# Patient Record
Sex: Male | Born: 1965 | Hispanic: Yes | Marital: Married | Smoking: Former smoker
Health system: Southern US, Community
[De-identification: ages and names within clinical notes are randomized; demographics above are authoritative.]

## PROBLEM LIST (undated history)

## (undated) DIAGNOSIS — I10 Essential (primary) hypertension: Secondary | ICD-10-CM

## (undated) DIAGNOSIS — K7460 Unspecified cirrhosis of liver: Secondary | ICD-10-CM

## (undated) DIAGNOSIS — E119 Type 2 diabetes mellitus without complications: Secondary | ICD-10-CM

## (undated) DIAGNOSIS — R188 Other ascites: Secondary | ICD-10-CM

## (undated) DIAGNOSIS — E785 Hyperlipidemia, unspecified: Secondary | ICD-10-CM

## (undated) HISTORY — PX: BACK SURGERY: SHX140

## (undated) HISTORY — PX: LIPOMA EXCISION: SHX5283

---

## 2008-05-28 ENCOUNTER — Emergency Department: Payer: Self-pay | Admitting: Emergency Medicine

## 2009-03-28 ENCOUNTER — Emergency Department: Payer: Self-pay | Admitting: Emergency Medicine

## 2010-05-05 IMAGING — CR CERVICAL SPINE - COMPLETE 4+ VIEW
1 series · 8 of 8 positions shown · non-contrast
Comparison: none

REASON FOR EXAM: pain in right side of neck; no injury
COMMENTS:

[Series 1: view not recorded · 0.17mm/px · 8 of 8 slices shown]
[im 1/8]
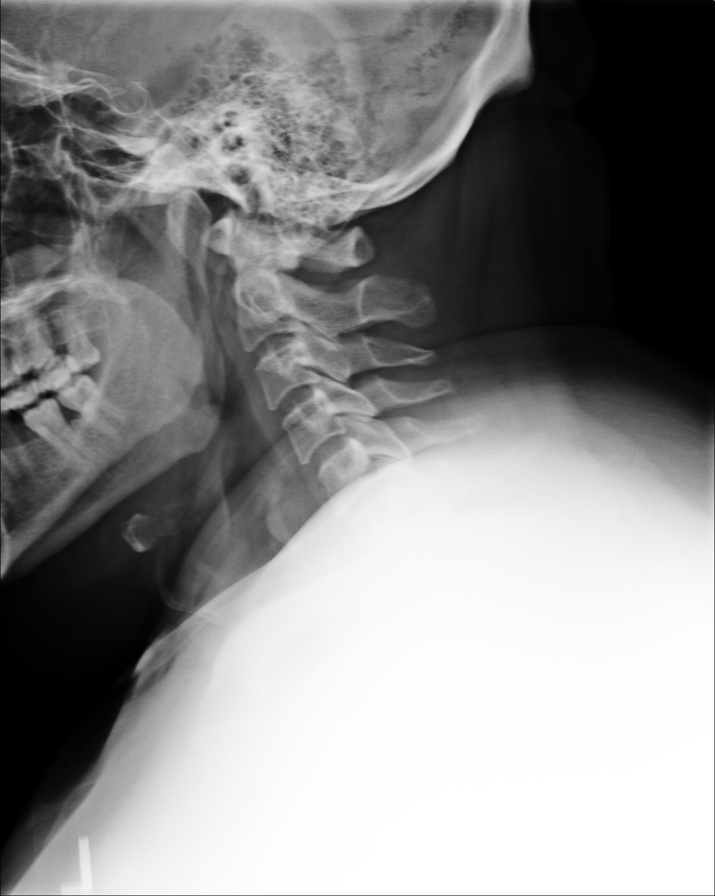
[im 2/8]
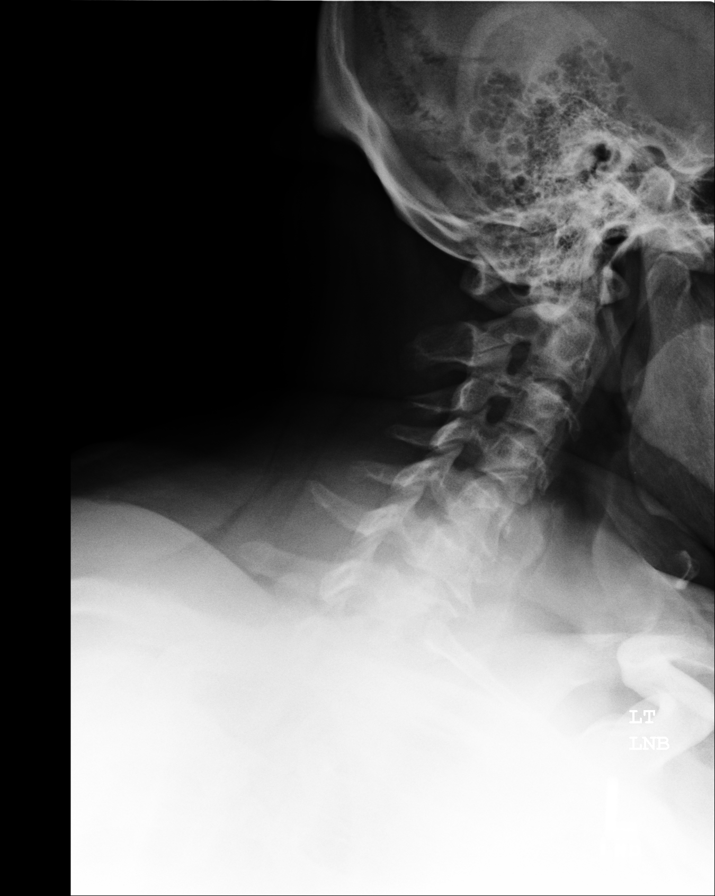
[im 3/8]
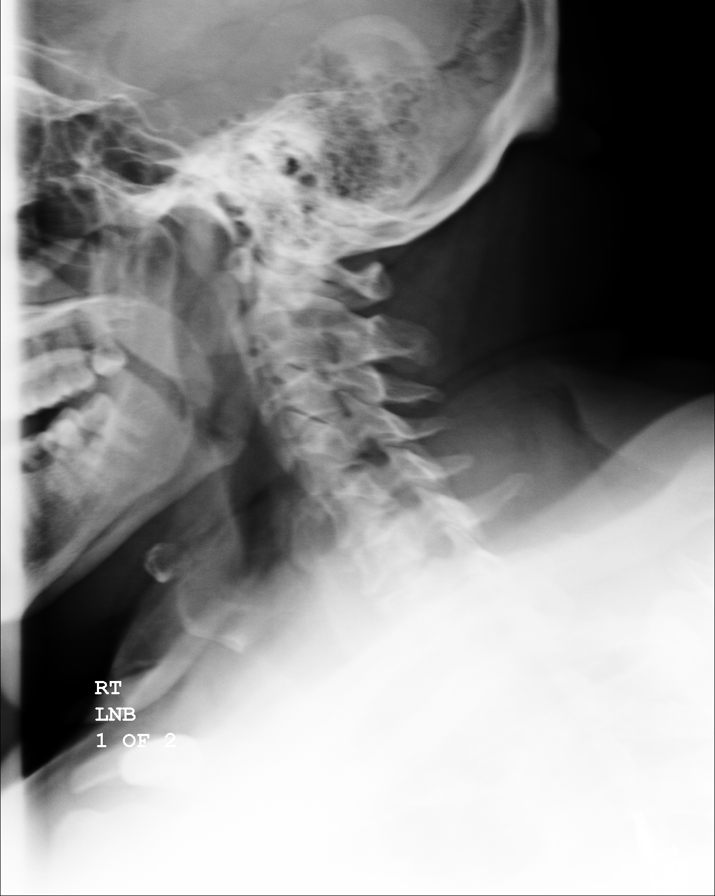
[im 4/8]
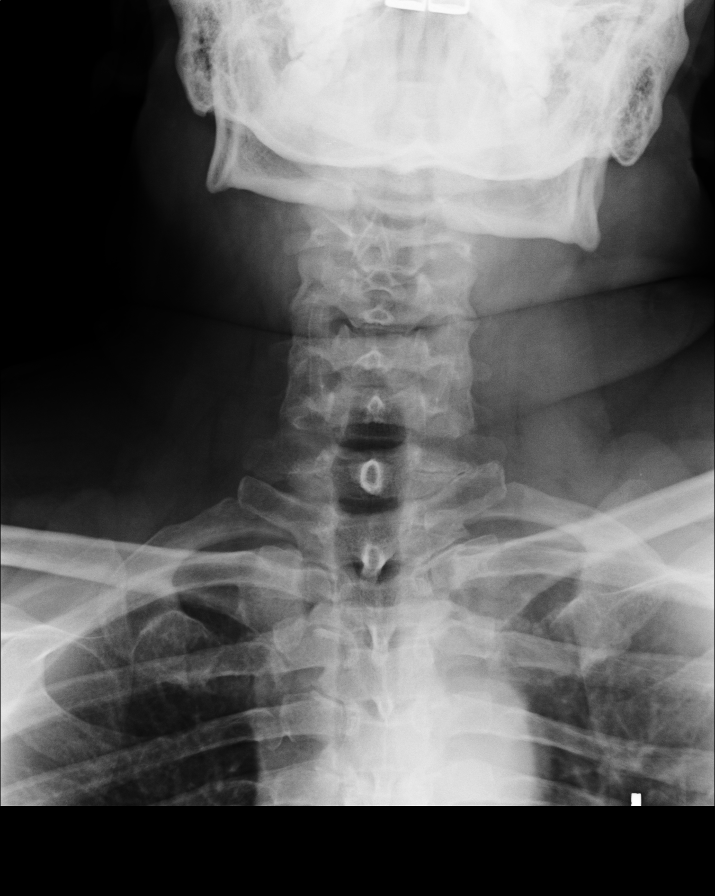
[im 5/8]
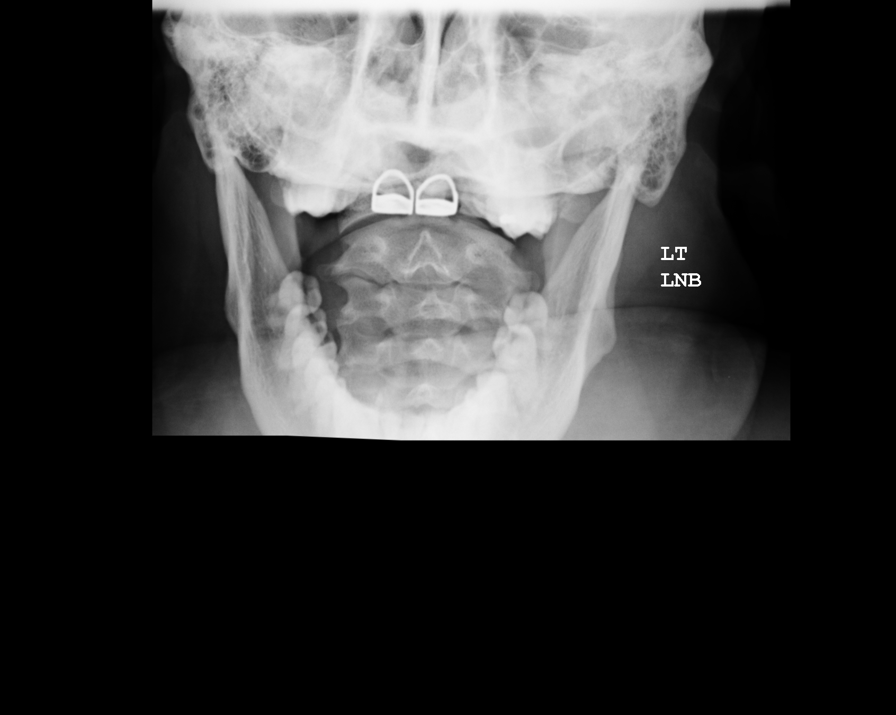
[im 6/8]
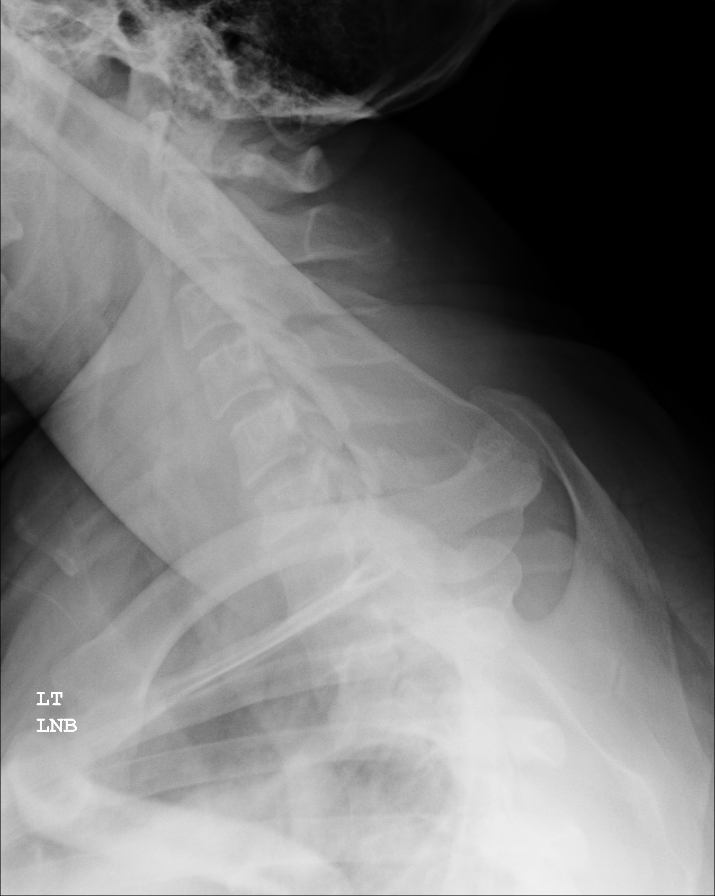
[im 7/8]
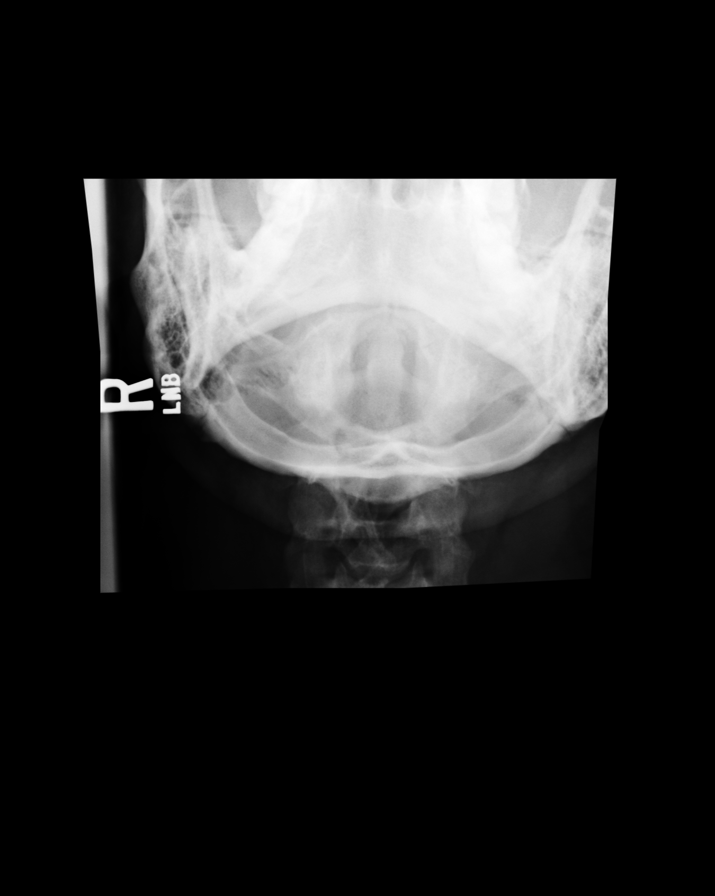
[im 8/8]
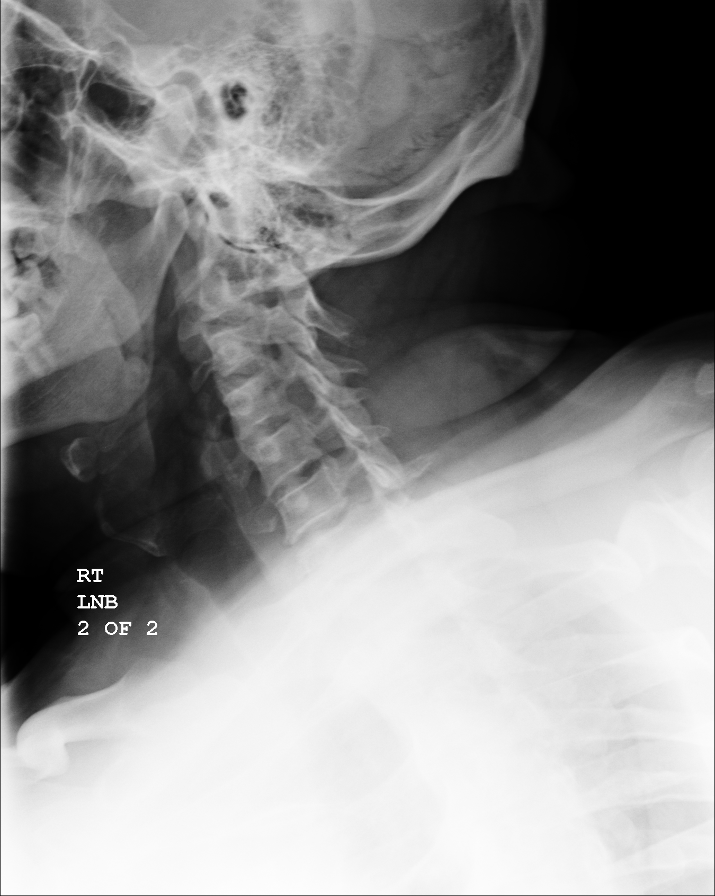

[8 of 8 positions shown; findings below may reference images not displayed]

PROCEDURE:     DXR - DXR CERVICAL SPINE COMPLETE  - May 28, 2008  [DATE]

RESULT:     The vertebral body heights and the intervertebral disc spaces
are well maintained. The vertebral body alignment is normal. Oblique view
shows no significant abnormalities of the articular facets. The odontoid
process is intact. The soft tissues of the cervical area reveal no
significant abnormalities.
IMPRESSION: No acute changes are identified.

## 2011-03-05 IMAGING — US ABDOMEN ULTRASOUND
1 series · 17 of 25 positions shown · non-contrast
Comparison: none

REASON FOR EXAM: Abd pain, elevated LFT
COMMENTS:

[Series 1: abdomen ultrasound · 17 of 52 slices shown]
[im 1/52]
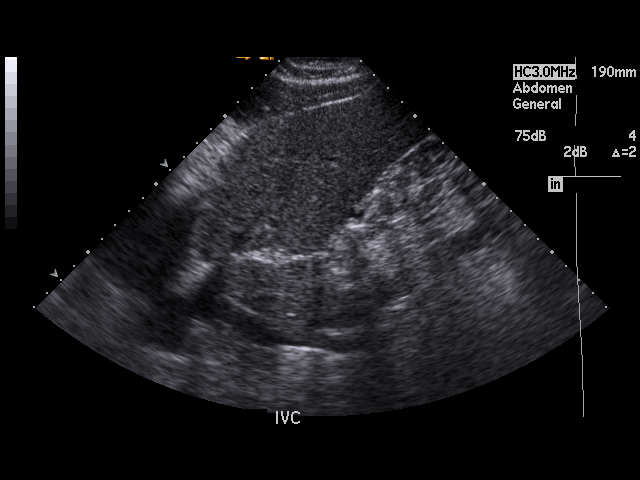
[im 5/52]
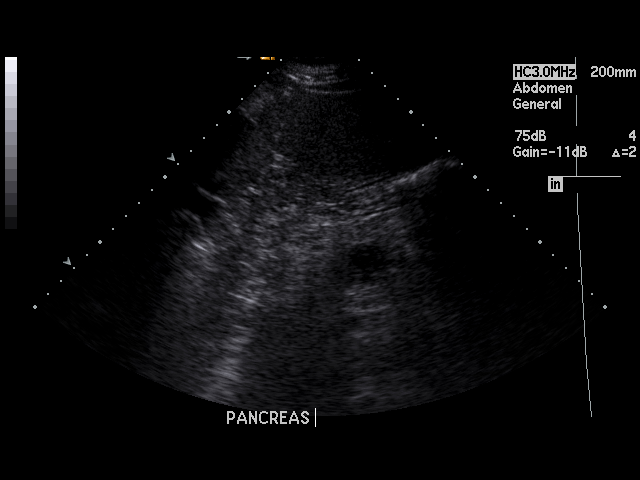
[im 7/52]
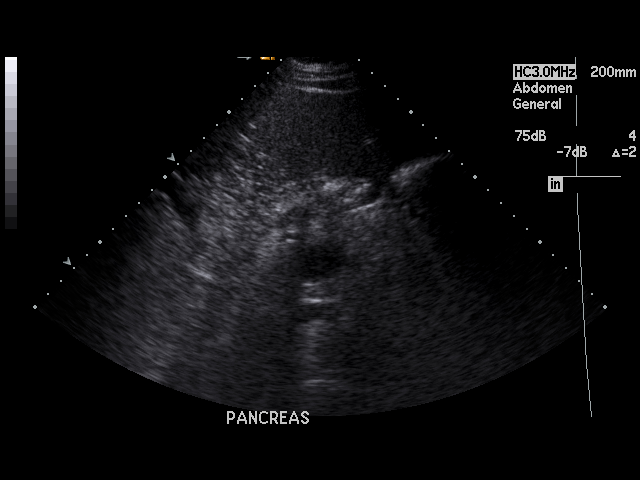
[im 11/52]
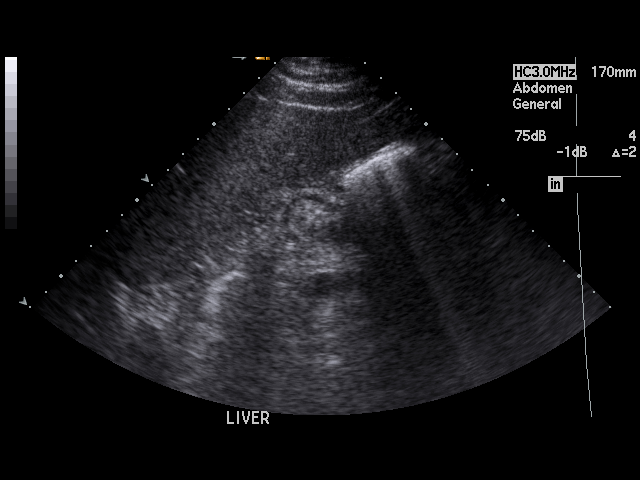
[im 13/52]
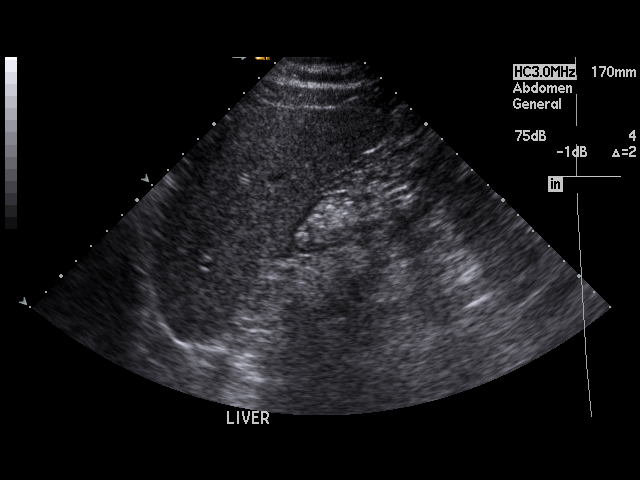
[im 18/52]
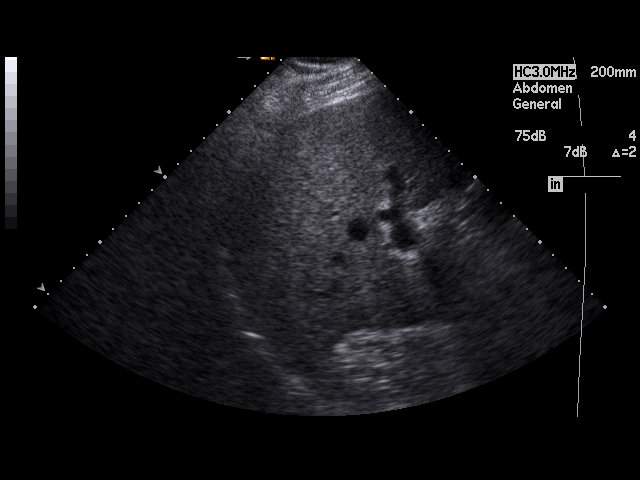
[im 20/52]
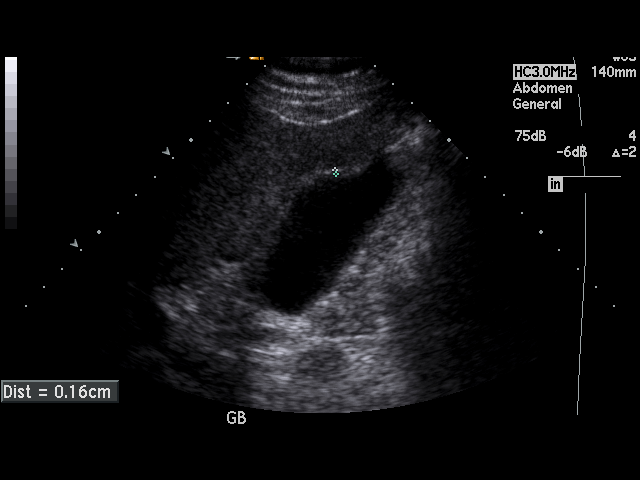
[im 24/52]
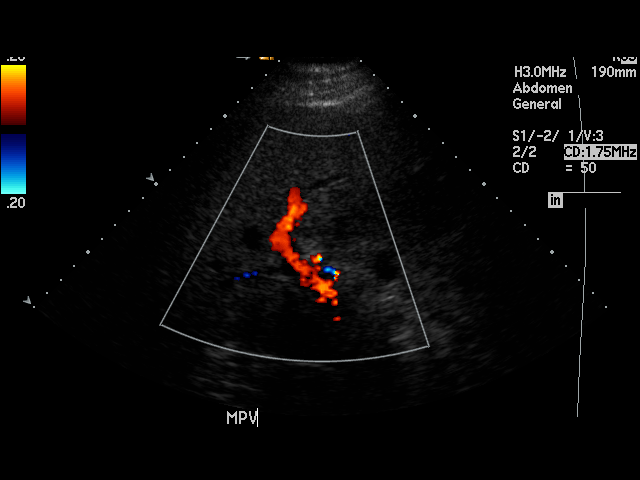
[im 26/52]
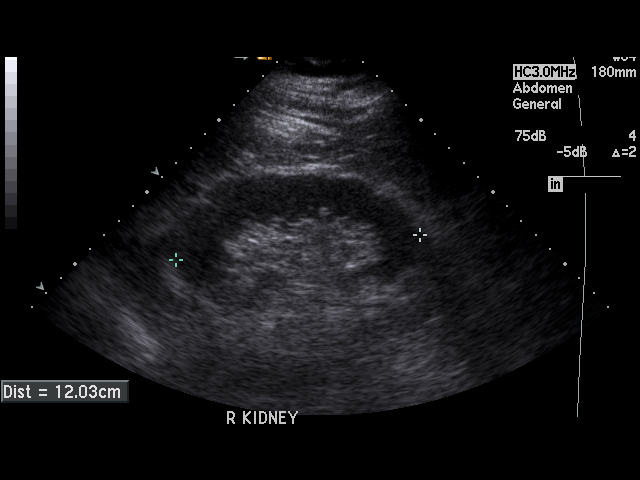
[im 28/52]
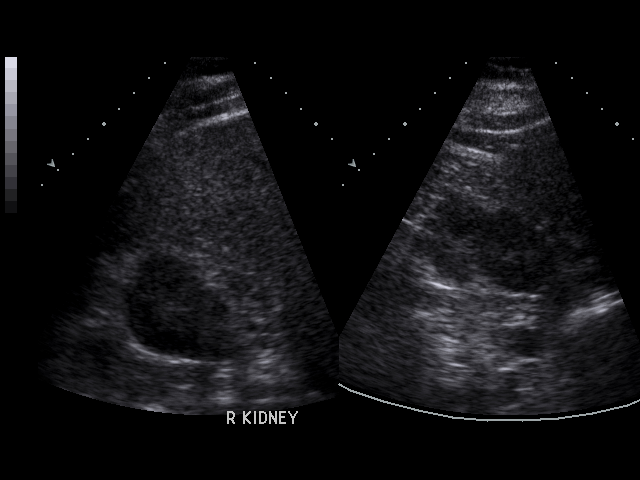
[im 32/52]
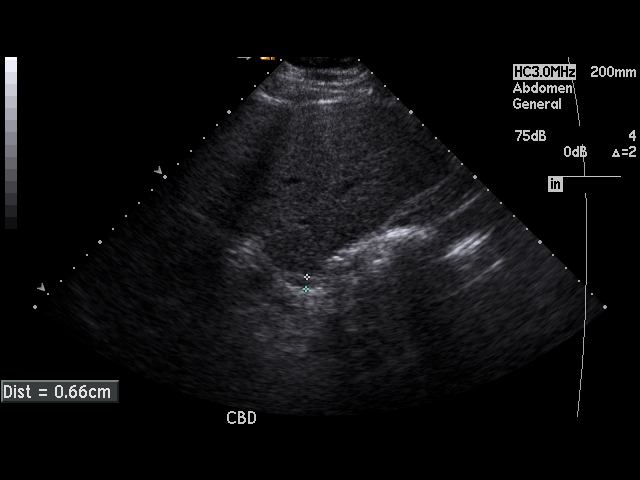
[im 35/52]
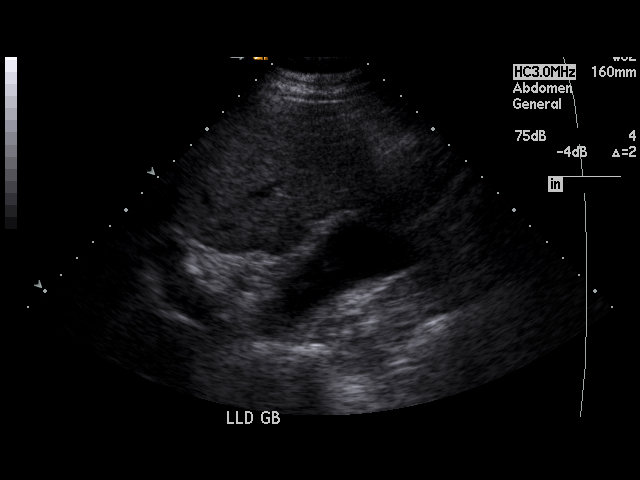
[im 39/52]
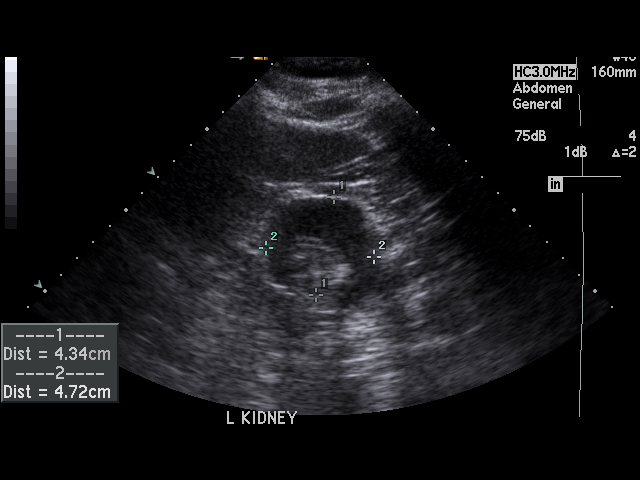
[im 41/52]
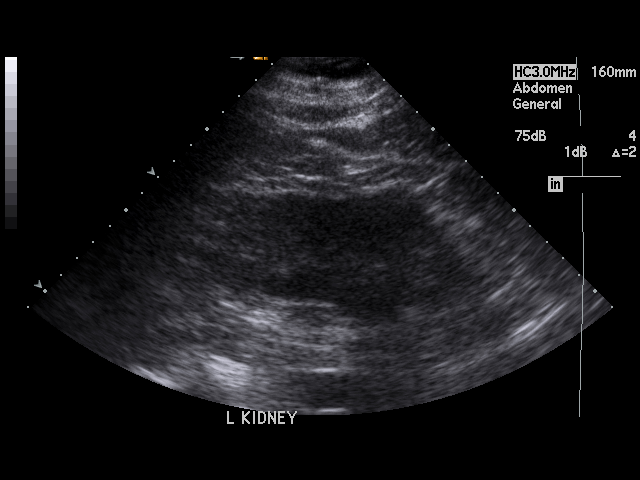
[im 45/52]
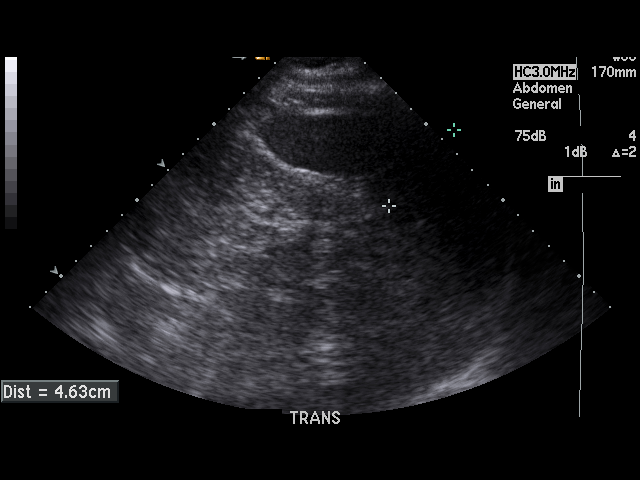
[im 47/52]
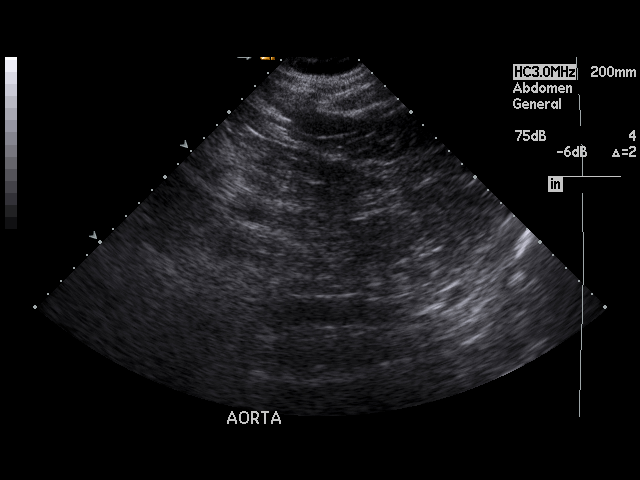
[im 52/52]
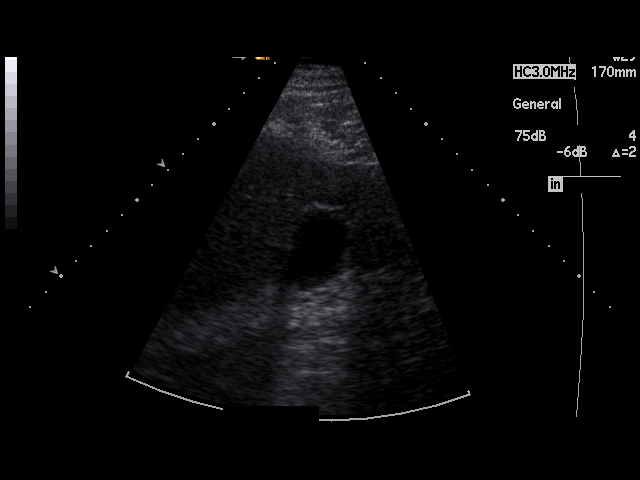

[17 of 25 positions shown; findings below may reference images not displayed]

PROCEDURE:     US  - US ABDOMEN GENERAL SURVEY  - March 28, 2009 [DATE]

RESULT:     The liver exhibits normal echotexture with no focal mass nor
ductal dilation. There may be an element of fatty infiltration present.
Portal venous flow is normal in direction toward the liver. The gallbladder
is adequately distended with no evidence of stones, wall thickening, or
pericholecystic fluid. The common bile duct is top normal at 6.8 mm in
diameter. The spleen is mildly enlarged at 13.8 mm in diameter. The pancreas
could not be demonstrated adequately due to the presence of bowel gas. The
abdominal aorta and kidneys are normal in appearance.
IMPRESSION: 1. There are mild fatty infiltrative changes of the liver. I see no evidence
of gallstones. The common bile duct is borderline dilated at 6.8 mm.
2. The spleen is top normal in size at 13.8 centimeters in greatest
dimension.

## 2014-04-04 LAB — MICROALBUMIN, URINE

## 2014-04-04 LAB — PSA

## 2014-05-01 ENCOUNTER — Emergency Department: Payer: Self-pay | Admitting: Emergency Medicine

## 2015-04-04 DIAGNOSIS — E119 Type 2 diabetes mellitus without complications: Secondary | ICD-10-CM | POA: Insufficient documentation

## 2015-04-04 DIAGNOSIS — I10 Essential (primary) hypertension: Secondary | ICD-10-CM | POA: Insufficient documentation

## 2015-04-04 DIAGNOSIS — E785 Hyperlipidemia, unspecified: Secondary | ICD-10-CM

## 2015-04-09 ENCOUNTER — Encounter: Payer: Self-pay | Admitting: Unknown Physician Specialty

## 2015-04-09 ENCOUNTER — Ambulatory Visit (INDEPENDENT_AMBULATORY_CARE_PROVIDER_SITE_OTHER): Payer: BLUE CROSS/BLUE SHIELD | Admitting: Unknown Physician Specialty

## 2015-04-09 VITALS — BP 152/101 | HR 76 | Temp 99.1°F | Ht 64.3 in | Wt 261.6 lb

## 2015-04-09 DIAGNOSIS — E668 Other obesity: Secondary | ICD-10-CM | POA: Insufficient documentation

## 2015-04-09 DIAGNOSIS — Z0000 Encounter for general adult medical examination without abnormal findings: Secondary | ICD-10-CM

## 2015-04-09 DIAGNOSIS — E119 Type 2 diabetes mellitus without complications: Secondary | ICD-10-CM | POA: Diagnosis not present

## 2015-04-09 DIAGNOSIS — E785 Hyperlipidemia, unspecified: Secondary | ICD-10-CM

## 2015-04-09 DIAGNOSIS — I10 Essential (primary) hypertension: Secondary | ICD-10-CM

## 2015-04-09 DIAGNOSIS — E669 Obesity, unspecified: Secondary | ICD-10-CM | POA: Insufficient documentation

## 2015-04-09 DIAGNOSIS — G4730 Sleep apnea, unspecified: Secondary | ICD-10-CM | POA: Diagnosis not present

## 2015-04-09 LAB — LIPID PANEL PICCOLO, WAIVED
CHOL/HDL RATIO PICCOLO,WAIVE: 3.5 mg/dL
CHOLESTEROL PICCOLO, WAIVED: 217 mg/dL — AB (ref <200)
HDL CHOL PICCOLO, WAIVED: 63 mg/dL (ref >59)
LDL Chol Calc Piccolo Waived: 116 mg/dL — ABNORMAL HIGH (ref <100)
Triglycerides Piccolo,Waived: 194 mg/dL — ABNORMAL HIGH (ref <150)
VLDL Chol Calc Piccolo,Waive: 39 mg/dL — ABNORMAL HIGH (ref <30)

## 2015-04-09 LAB — MICROALBUMIN, URINE WAIVED
Creatinine, Urine Waived: 300 mg/dL (ref 10–300)
Microalb, Ur Waived: 80 mg/L — ABNORMAL HIGH (ref 0–19)

## 2015-04-09 LAB — BAYER DCA HB A1C WAIVED: HB A1C (BAYER DCA - WAIVED): 10.2 % — ABNORMAL HIGH (ref <7.0)

## 2015-04-09 MED ORDER — METFORMIN HCL 500 MG PO TABS: 500.0000 mg | ORAL_TABLET | Freq: Two times a day (BID) | ORAL | Status: DC

## 2015-04-09 MED ORDER — LISINOPRIL 10 MG PO TABS: 10.0000 mg | ORAL_TABLET | Freq: Every day | ORAL | Status: DC

## 2015-04-09 MED ORDER — ATORVASTATIN CALCIUM 20 MG PO TABS: 20.0000 mg | ORAL_TABLET | Freq: Every day | ORAL | Status: DC

## 2015-04-09 NOTE — Assessment & Plan Note (Signed)
High today.  Restart Lisinopril

## 2015-04-09 NOTE — Assessment & Plan Note (Signed)
Order sleep study 

## 2015-04-09 NOTE — Progress Notes (Signed)
BP 152/101 mmHg  Pulse 76  Temp(Src) 99.1 F (37.3 C)  Ht 5' 4.3" (1.633 m)  Wt 261 lb 9.6 oz (118.661 kg)  BMI 44.50 kg/m2  SpO2 94%   Subjective:    Patient ID: Jonathan Griffin, male    DOB: 1965-06-02, 50 y.o.   MRN: [id number]  HPI: Jonathan Griffin is a 50 y.o. male  Chief Complaint  Patient presents with  . Annual Exam   Pt with a history of diabetes and hypertension.  He is currently lost to follow up and out of medications.  Pt denies any problems.  He does admit to snoring at night, periods of apnea, and falling asleep during the day  Family History  Problem Relation Age of Onset  . Diabetes Father   . Hypertension Father    Past Surgical History  Procedure Laterality Date  . Lipoma excision      neck    Relevant past medical, surgical, family and social history reviewed and updated as indicated. Interim medical history since our last visit reviewed. Allergies and medications reviewed and updated.  Review of Systems  Constitutional: Negative.   HENT: Negative.   Eyes: Negative.   Respiratory: Negative.   Cardiovascular: Negative.   Gastrointestinal: Negative.   Endocrine: Negative.   Genitourinary: Negative.   Skin: Negative.   Allergic/Immunologic: Negative.   Neurological: Negative.   Hematological: Negative.   Psychiatric/Behavioral: Negative.     Per HPI unless specifically indicated above     Objective:    BP 152/101 mmHg  Pulse 76  Temp(Src) 99.1 F (37.3 C)  Ht 5' 4.3" (1.633 m)  Wt 261 lb 9.6 oz (118.661 kg)  BMI 44.50 kg/m2  SpO2 94%  Wt Readings from Last 3 Encounters:  04/09/15 261 lb 9.6 oz (118.661 kg)  04/04/14 262 lb (118.842 kg)    Physical Exam  Constitutional: He is oriented to person, place, and time. He appears well-developed and well-nourished.  HENT:  Head: Normocephalic.  Right Ear: Tympanic membrane, external ear and ear canal normal.  Left Ear: Tympanic membrane, external ear and ear canal normal.   Mouth/Throat: Uvula is midline, oropharynx is clear and moist and mucous membranes are normal.  Eyes: Pupils are equal, round, and reactive to light.  Cardiovascular: Normal rate, regular rhythm and normal heart sounds.  Exam reveals no gallop and no friction rub.   No murmur heard. Pulmonary/Chest: Effort normal and breath sounds normal. No respiratory distress.  Abdominal: Soft. Bowel sounds are normal. He exhibits no distension. There is no tenderness.  Musculoskeletal: Normal range of motion.  Neurological: He is alert and oriented to person, place, and time. He has normal reflexes.  Skin: Skin is warm and dry.  Psychiatric: He has a normal mood and affect. His behavior is normal. Judgment and thought content normal.  Nursing note and vitals reviewed.  Diabetic Foot Exam - Simple   Simple Foot Form  Diabetic Foot exam was performed with the following findings:  Yes 04/09/2015 10:28 AM  Visual Inspection  No deformities, no ulcerations, no other skin breakdown bilaterally:  Yes  Sensation Testing  Intact to touch and monofilament testing bilaterally:  Yes  Pulse Check  Posterior Tibialis and Dorsalis pulse intact bilaterally:  Yes  Comments         Assessment & Plan:   Problem List Items Addressed This Visit      Unprioritized   Hyperlipidemia    Start Atorvastain 20 mg due to risk factors.  Awaiting lipid panel      Relevant Medications   lisinopril (PRINIVIL,ZESTRIL) 10 MG tablet   atorvastatin (LIPITOR) 20 MG tablet   Other Relevant Orders   Lipid Panel Piccolo, Waived   Type 2 diabetes mellitus without complications (HCC)    Lost to f/u.  Hgb A1C is 10.5.  Refusing lifestyle center      Relevant Medications   metFORMIN (GLUCOPHAGE) 500 MG tablet   lisinopril (PRINIVIL,ZESTRIL) 10 MG tablet   atorvastatin (LIPITOR) 20 MG tablet   Other Relevant Orders   Bayer DCA Hb A1c Waived   Hypertension - Primary    High today.  Restart Lisinopril      Relevant  Medications   lisinopril (PRINIVIL,ZESTRIL) 10 MG tablet   atorvastatin (LIPITOR) 20 MG tablet   Other Relevant Orders   Comprehensive metabolic panel   Microalbumin, Urine Waived   Uric acid   Sleep apnea    Order sleep study      Relevant Orders   Ambulatory referral to Sleep Studies   Extreme obesity (HCC)   Relevant Medications   metFORMIN (GLUCOPHAGE) 500 MG tablet   Other Relevant Orders   Ambulatory referral to Sleep Studies    Other Visit Diagnoses    Routine general medical examination at a health care facility        Relevant Orders    CBC with Differential/Platelet    HIV antibody    TSH    PSA        Follow up plan: Return in about 4 weeks (around 05/07/2015).

## 2015-04-09 NOTE — Assessment & Plan Note (Addendum)
Lost to f/u.  Hgb A1C is 10.5.  Refusing lifestyle center

## 2015-04-09 NOTE — Assessment & Plan Note (Addendum)
Start Atorvastain 20 mg due to risk factors.  Awaiting lipid panel

## 2015-04-09 NOTE — Patient Instructions (Signed)
Diabetes and Exercise Exercising regularly is important. It is not just about losing weight. It has many health benefits, such as:  Improving your overall fitness, flexibility, and endurance.  Increasing your bone density.  Helping with weight control.  Decreasing your body fat.  Increasing your muscle strength.  Reducing stress and tension.  Improving your overall health. People with diabetes who exercise gain additional benefits because exercise:  Reduces appetite.  Improves the body's use of blood sugar (glucose).  Helps lower or control blood glucose.  Decreases blood pressure.  Helps control blood lipids (such as cholesterol and triglycerides).  Improves the body's use of the hormone insulin by:  Increasing the body's insulin sensitivity.  Reducing the body's insulin needs.  Decreases the risk for heart disease because exercising:  Lowers cholesterol and triglycerides levels.  Increases the levels of good cholesterol (such as high-density lipoproteins [HDL]) in the body.  Lowers blood glucose levels. YOUR ACTIVITY PLAN  Choose an activity that you enjoy, and set realistic goals. To exercise safely, you should begin practicing any new physical activity slowly, and gradually increase the intensity of the exercise over time. Your health care provider or diabetes educator can help create an activity plan that works for you. General recommendations include:  Encouraging children to engage in at least 60 minutes of physical activity each day.  Stretching and performing strength training exercises, such as yoga or weight lifting, at least 2 times per week.  Performing a total of at least 150 minutes of moderate-intensity exercise each week, such as brisk walking or water aerobics.  Exercising at least 3 days per week, making sure you allow no more than 2 consecutive days to pass without exercising.  Avoiding long periods of inactivity (90 minutes or more). When you  have to spend an extended period of time sitting down, take frequent breaks to walk or stretch. RECOMMENDATIONS FOR EXERCISING WITH TYPE 1 OR TYPE 2 DIABETES   Check your blood glucose before exercising. If blood glucose levels are greater than 240 mg/dL, check for urine ketones. Do not exercise if ketones are present.  Avoid injecting insulin into areas of the body that are going to be exercised. For example, avoid injecting insulin into:  The arms when playing tennis.  The legs when jogging.  Keep a record of:  Food intake before and after you exercise.  Expected peak times of insulin action.  Blood glucose levels before and after you exercise.  The type and amount of exercise you have done.  Review your records with your health care provider. Your health care provider will help you to develop guidelines for adjusting food intake and insulin amounts before and after exercising.  If you take insulin or oral hypoglycemic agents, watch for signs and symptoms of hypoglycemia. They include:  Dizziness.  Shaking.  Sweating.  Chills.  Confusion.  Drink plenty of water while you exercise to prevent dehydration or heat stroke. Body water is lost during exercise and must be replaced.  Talk to your health care provider before starting an exercise program to make sure it is safe for you. Remember, almost any type of activity is better than none.   This information is not intended to replace advice given to you by your health care provider. Make sure you discuss any questions you have with your health care provider.   Document Released: 04/11/2003 Document Revised: 06/05/2014 Document Reviewed: 06/28/2012 Elsevier Interactive Patient Education 2016 [employer] Diabetes Mellitus and Food It is important for   you to manage your blood sugar (glucose) level. Your blood glucose level can be greatly affected by what you eat. Eating healthier foods in the appropriate amounts throughout  the day at about the same time each day will help you control your blood glucose level. It can also help slow or prevent worsening of your diabetes mellitus. Healthy eating may even help you improve the level of your blood pressure and reach or maintain a healthy weight.  General recommendations for healthful eating and cooking habits include:  Eating meals and snacks regularly. Avoid going long periods of time without eating to lose weight.  Eating a diet that consists mainly of plant-based foods, such as fruits, vegetables, nuts, legumes, and whole grains.  Using low-heat cooking methods, such as baking, instead of high-heat cooking methods, such as deep frying. Work with your dietitian to make sure you understand how to use the Nutrition Facts information on food labels. HOW CAN FOOD AFFECT ME? Carbohydrates Carbohydrates affect your blood glucose level more than any other type of food. Your dietitian will help you determine how many carbohydrates to eat at each meal and teach you how to count carbohydrates. Counting carbohydrates is important to keep your blood glucose at a healthy level, especially if you are using insulin or taking certain medicines for diabetes mellitus. Alcohol Alcohol can cause sudden decreases in blood glucose (hypoglycemia), especially if you use insulin or take certain medicines for diabetes mellitus. Hypoglycemia can be a life-threatening condition. Symptoms of hypoglycemia (sleepiness, dizziness, and disorientation) are similar to symptoms of having too much alcohol.  If your health care provider has given you approval to drink alcohol, do so in moderation and use the following guidelines:  Women should not have more than one drink per day, and men should not have more than two drinks per day. One drink is equal to:  12 oz of beer.  5 oz of wine.  1 oz of hard liquor.  Do not drink on an empty stomach.  Keep yourself hydrated. Have water, diet soda, or  unsweetened iced tea.  Regular soda, juice, and other mixers might contain a lot of carbohydrates and should be counted. WHAT FOODS ARE NOT RECOMMENDED? As you make food choices, it is important to remember that all foods are not the same. Some foods have fewer nutrients per serving than other foods, even though they might have the same number of calories or carbohydrates. It is difficult to get your body what it needs when you eat foods with fewer nutrients. Examples of foods that you should avoid that are high in calories and carbohydrates but low in nutrients include:  Trans fats (most processed foods list trans fats on the Nutrition Facts label).  Regular soda.  Juice.  Candy.  Sweets, such as cake, pie, doughnuts, and cookies.  Fried foods. WHAT FOODS CAN I EAT? Eat nutrient-rich foods, which will nourish your body and keep you healthy. The food you should eat also will depend on several factors, including:  The calories you need.  The medicines you take.  Your weight.  Your blood glucose level.  Your blood pressure level.  Your cholesterol level. You should eat a variety of foods, including:  Protein.  Lean cuts of meat.  Proteins low in saturated fats, such as fish, egg whites, and beans. Avoid processed meats.  Fruits and vegetables.  Fruits and vegetables that may help control blood glucose levels, such as apples, mangoes, and yams.  Dairy products.  Choose fat-free   or low-fat dairy products, such as milk, yogurt, and cheese.  Grains, bread, pasta, and rice.  Choose whole grain products, such as multigrain bread, whole oats, and brown rice. These foods may help control blood pressure.  Fats.  Foods containing healthful fats, such as nuts, avocado, olive oil, canola oil, and fish. DOES EVERYONE WITH DIABETES MELLITUS HAVE THE SAME MEAL PLAN? Because every person with diabetes mellitus is different, there is not one meal plan that works for everyone. It  is very important that you meet with a dietitian who will help you create a meal plan that is just right for you.   This information is not intended to replace advice given to you by your health care provider. Make sure you discuss any questions you have with your health care provider.   Document Released: 10/16/2004 Document Revised: 02/09/2014 Document Reviewed: 12/16/2012 Elsevier Interactive Patient Education 2016 [employer] Tips for Eating Away From Home If You Have Diabetes Controlling your level of blood glucose, also known as blood sugar, can be challenging. It can be even more difficult when you do not prepare your own meals. The following tips can help you manage your diabetes when you eat away from home. PLANNING AHEAD Plan ahead if you know you will be eating away from home:  Ask your health care provider how to time meals and medicine if you are taking insulin.  Make a list of restaurants near you that offer healthy choices. If they have a carry-out menu, take it home and plan what you will order ahead of time.  Look up the restaurant you want to eat at online. Many chain and fast-food restaurants list nutritional information online. Use this information to choose the healthiest options and to calculate how many carbohydrates will be in your meal.  Use a carbohydrate-counting book or mobile app to look up the carbohydrate content and serving size of the foods you want to eat.  Become familiar with serving sizes and learn to recognize how many servings are in a portion. This will allow you to estimate how many carbohydrates you can eat. FREE FOODS A "free food" is any food or drink that has less than 5 g of carbohydrates per serving. Free foods include:  Many vegetables.  Hard boiled eggs.  Nuts or seeds.  Olives.  Cheeses.  Meats. These types of foods make good appetizer choices and are often available at salad bars. Lemon juice, vinegar, or a low-calorie salad  dressing of fewer than 20 calories per serving can be used as a "free" salad dressing.  CHOICES TO REDUCE CARBOHYDRATES  Substitute nonfat sweetened yogurt with a sugar-free yogurt. Yogurt made from soy milk may also be used, but you will still want a sugar-free or plain option to choose a lower carbohydrate amount.  Ask your server to take away the bread basket or chips from your table.  Order fresh fruit. A salad bar often offers fresh fruit choices. Avoid canned fruit because it is usually packed in sugar or syrup.  Order a salad, and eat it without dressing. Or, create a "free" salad dressing.  Ask for substitutions. For example, instead of French fries, request an order of a vegetable such as salad, green beans, or broccoli. OTHER TIPS   If you take insulin, take the insulin once your food arrives to your table. This will ensure your insulin and food are timed correctly.  Ask your server about the portion size before your order, and ask for a   take-out box if the portion has more servings than you should have. When your food comes, leave the amount you should have on the plate, and put the rest in the take-out box.  Consider splitting an entree with someone and ordering a side salad.   This information is not intended to replace advice given to you by your health care provider. Make sure you discuss any questions you have with your health care provider.   Document Released: 01/19/2005 Document Revised: 10/10/2014 Document Reviewed: 04/18/2013 Elsevier Interactive Patient Education 2016 [employer]  

## 2015-04-10 ENCOUNTER — Encounter: Payer: Self-pay | Admitting: Unknown Physician Specialty

## 2015-04-10 LAB — COMPREHENSIVE METABOLIC PANEL
ALT: 24 IU/L (ref 0–44)
AST: 27 IU/L (ref 0–40)
Albumin/Globulin Ratio: 1.3 (ref 1.1–2.5)
Albumin: 4.2 g/dL (ref 3.5–5.5)
Alkaline Phosphatase: 136 IU/L — ABNORMAL HIGH (ref 39–117)
BUN/Creatinine Ratio: 11 (ref 9–20)
BUN: 8 mg/dL (ref 6–24)
Bilirubin Total: 0.7 mg/dL (ref 0.0–1.2)
CALCIUM: 9.1 mg/dL (ref 8.7–10.2)
CO2: 24 mmol/L (ref 18–29)
Chloride: 101 mmol/L (ref 96–106)
Creatinine, Ser: 0.71 mg/dL — ABNORMAL LOW (ref 0.76–1.27)
GFR calc Af Amer: 126 mL/min/{1.73_m2} (ref >59)
GFR, EST NON AFRICAN AMERICAN: 109 mL/min/{1.73_m2} (ref >59)
GLOBULIN, TOTAL: 3.2 g/dL (ref 1.5–4.5)
Glucose: 243 mg/dL — ABNORMAL HIGH (ref 65–99)
Potassium: 4.2 mmol/L (ref 3.5–5.2)
Sodium: 141 mmol/L (ref 134–144)
Total Protein: 7.4 g/dL (ref 6.0–8.5)

## 2015-04-10 LAB — CBC WITH DIFFERENTIAL/PLATELET
Basophils Absolute: 0 10*3/uL (ref 0.0–0.2)
Basos: 1 %
EOS (ABSOLUTE): 0.3 10*3/uL (ref 0.0–0.4)
EOS: 5 %
HEMATOCRIT: 43.9 % (ref 37.5–51.0)
HEMOGLOBIN: 14.9 g/dL (ref 12.6–17.7)
IMMATURE GRANS (ABS): 0 10*3/uL (ref 0.0–0.1)
IMMATURE GRANULOCYTES: 0 %
LYMPHS: 36 %
Lymphocytes Absolute: 2.1 10*3/uL (ref 0.7–3.1)
MCH: 32.3 pg (ref 26.6–33.0)
MCHC: 33.9 g/dL (ref 31.5–35.7)
MCV: 95 fL (ref 79–97)
Monocytes Absolute: 0.6 10*3/uL (ref 0.1–0.9)
Monocytes: 11 %
NEUTROS PCT: 47 %
Neutrophils Absolute: 2.7 10*3/uL (ref 1.4–7.0)
PLATELETS: 190 10*3/uL (ref 150–379)
RBC: 4.61 x10E6/uL (ref 4.14–5.80)
RDW: 14 % (ref 12.3–15.4)
WBC: 5.7 10*3/uL (ref 3.4–10.8)

## 2015-04-10 LAB — PSA: PROSTATE SPECIFIC AG, SERUM: 0.6 ng/mL (ref 0.0–4.0)

## 2015-04-10 LAB — HIV ANTIBODY (ROUTINE TESTING W REFLEX): HIV SCREEN 4TH GENERATION: NONREACTIVE

## 2015-04-10 LAB — TSH: TSH: 1.45 u[IU]/mL (ref 0.450–4.500)

## 2015-04-10 LAB — URIC ACID: Uric Acid: 4.8 mg/dL (ref 3.7–8.6)

## 2015-04-29 ENCOUNTER — Telehealth: Payer: Self-pay

## 2015-04-29 NOTE — Telephone Encounter (Signed)
We had referred the patient for a sleep study at his last visit. They faxed us back the paperwork and stated that they do not have a working phone number for the patient because the number they tried was a different person. I looked in patient's old chart and tried to call the number that was listed for patient's mobile. I left a voicemail asking for a return call.

## 2015-05-01 NOTE — Telephone Encounter (Signed)
Called phone number listed in patient's practice partner chart and left a voicemail asking if he could please return my call.

## 2015-05-03 NOTE — Telephone Encounter (Signed)
Called the phone number that was listed in practice partner. A man answered and stated that this was not the patient and asked for this number be removed from the call list because it is a business phone.

## 2015-05-13 ENCOUNTER — Ambulatory Visit: Payer: BLUE CROSS/BLUE SHIELD | Admitting: Unknown Physician Specialty

## 2015-05-20 ENCOUNTER — Ambulatory Visit: Payer: BLUE CROSS/BLUE SHIELD | Admitting: Unknown Physician Specialty

## 2015-05-31 ENCOUNTER — Emergency Department
Admission: EM | Admit: 2015-05-31 | Discharge: 2015-05-31 | Disposition: A | Discharge status: Home / Self Care | Payer: BLUE CROSS/BLUE SHIELD | Attending: Emergency Medicine | Admitting: Emergency Medicine

## 2015-05-31 ENCOUNTER — Emergency Department: Payer: BLUE CROSS/BLUE SHIELD

## 2015-05-31 ENCOUNTER — Encounter: Payer: Self-pay | Admitting: Emergency Medicine

## 2015-05-31 DIAGNOSIS — Z79899 Other long term (current) drug therapy: Secondary | ICD-10-CM | POA: Diagnosis not present

## 2015-05-31 DIAGNOSIS — I10 Essential (primary) hypertension: Secondary | ICD-10-CM | POA: Diagnosis not present

## 2015-05-31 DIAGNOSIS — E785 Hyperlipidemia, unspecified: Secondary | ICD-10-CM | POA: Diagnosis not present

## 2015-05-31 DIAGNOSIS — S39012A Strain of muscle, fascia and tendon of lower back, initial encounter: Secondary | ICD-10-CM

## 2015-05-31 DIAGNOSIS — E119 Type 2 diabetes mellitus without complications: Secondary | ICD-10-CM | POA: Insufficient documentation

## 2015-05-31 DIAGNOSIS — Y9389 Activity, other specified: Secondary | ICD-10-CM | POA: Insufficient documentation

## 2015-05-31 DIAGNOSIS — Y929 Unspecified place or not applicable: Secondary | ICD-10-CM | POA: Insufficient documentation

## 2015-05-31 DIAGNOSIS — Z7984 Long term (current) use of oral hypoglycemic drugs: Secondary | ICD-10-CM | POA: Insufficient documentation

## 2015-05-31 DIAGNOSIS — Y990 Civilian activity done for income or pay: Secondary | ICD-10-CM | POA: Insufficient documentation

## 2015-05-31 DIAGNOSIS — X500XXA Overexertion from strenuous movement or load, initial encounter: Secondary | ICD-10-CM | POA: Insufficient documentation

## 2015-05-31 DIAGNOSIS — M791 Myalgia: Secondary | ICD-10-CM | POA: Insufficient documentation

## 2015-05-31 DIAGNOSIS — M7918 Myalgia, other site: Secondary | ICD-10-CM

## 2015-05-31 DIAGNOSIS — M25511 Pain in right shoulder: Secondary | ICD-10-CM | POA: Diagnosis present

## 2015-05-31 HISTORY — DX: Hyperlipidemia, unspecified: E78.5

## 2015-05-31 HISTORY — DX: Essential (primary) hypertension: I10

## 2015-05-31 HISTORY — DX: Type 2 diabetes mellitus without complications: E11.9

## 2015-05-31 LAB — COMPREHENSIVE METABOLIC PANEL
ALT: 36 U/L (ref 17–63)
AST: 49 U/L — AB (ref 15–41)
Albumin: 4.2 g/dL (ref 3.5–5.0)
Alkaline Phosphatase: 98 U/L (ref 38–126)
Anion gap: 10 (ref 5–15)
BUN: 7 mg/dL (ref 6–20)
CHLORIDE: 109 mmol/L (ref 101–111)
CO2: 21 mmol/L — AB (ref 22–32)
CREATININE: 0.61 mg/dL (ref 0.61–1.24)
Calcium: 8.9 mg/dL (ref 8.9–10.3)
GFR calc Af Amer: >60 mL/min (ref >60)
Glucose, Bld: 148 mg/dL — ABNORMAL HIGH (ref 65–99)
Potassium: 3.9 mmol/L (ref 3.5–5.1)
SODIUM: 140 mmol/L (ref 135–145)
Total Bilirubin: 0.9 mg/dL (ref 0.3–1.2)
Total Protein: 7.7 g/dL (ref 6.5–8.1)

## 2015-05-31 LAB — CBC WITH DIFFERENTIAL/PLATELET
Basophils Absolute: 0 10*3/uL (ref 0–0.1)
Basophils Relative: 1 %
Eosinophils Absolute: 0.3 10*3/uL (ref 0–0.7)
Eosinophils Relative: 5 %
HEMATOCRIT: 41.1 % (ref 40.0–52.0)
Hemoglobin: 14.4 g/dL (ref 13.0–18.0)
LYMPHS ABS: 1.9 10*3/uL (ref 1.0–3.6)
LYMPHS PCT: 36 %
MCH: 32.4 pg (ref 26.0–34.0)
MCHC: 35 g/dL (ref 32.0–36.0)
MCV: 92.4 fL (ref 80.0–100.0)
MONO ABS: 0.6 10*3/uL (ref 0.2–1.0)
MONOS PCT: 11 %
NEUTROS ABS: 2.6 10*3/uL (ref 1.4–6.5)
Neutrophils Relative %: 47 %
Platelets: 171 10*3/uL (ref 150–440)
RBC: 4.45 MIL/uL (ref 4.40–5.90)
RDW: 14.2 % (ref 11.5–14.5)
WBC: 5.4 10*3/uL (ref 3.8–10.6)

## 2015-05-31 LAB — TROPONIN I: Troponin I: <0.03 ng/mL (ref <0.031)

## 2015-05-31 LAB — LIPASE, BLOOD: Lipase: 32 U/L (ref 11–51)

## 2015-05-31 MED ORDER — DIAZEPAM 5 MG PO TABS: 5.0000 mg | ORAL_TABLET | Freq: Three times a day (TID) | ORAL | Status: DC | PRN

## 2015-05-31 MED ORDER — NAPROXEN 500 MG PO TABS: 500.0000 mg | ORAL_TABLET | Freq: Two times a day (BID) | ORAL | Status: DC

## 2015-05-31 NOTE — Discharge Instructions (Signed)
You were prescribed a medication that is potentially sedating. Do not drink alcohol, drive or participate in any other potentially dangerous activities while taking this medication as it may make you sleepy. Do not take this medication with any other sedating medications, either prescription or over-the-counter. If you were prescribed Percocet or Vicodin, do not take these with acetaminophen (Tylenol) as it is already contained within these medications.   Opioid pain medications (or "narcotics") can be habit forming.  Use it as little as possible to achieve adequate pain control.  Do not use or use it with extreme caution if you have a history of opiate abuse or dependence.  If you are on a pain contract with your primary care doctor or a pain specialist, be sure to let them know you were prescribed this medication today from the [facility].  This medication is intended for your use only - do not give any to anyone else and keep it in a secure place where nobody else, especially children and pets, have access to it.  It will also cause or worsen constipation, so you may want to consider taking an over-the-counter stool softener while you are taking this medication.  Back Injury Prevention Back injuries can be very painful. They can also be difficult to heal. After having one back injury, you are more likely to injure your back again. It is important to learn how to avoid injuring or re-injuring your back. The following tips can help you to prevent a back injury. WHAT SHOULD I KNOW ABOUT PHYSICAL FITNESS?  Exercise for 30 minutes per day on most days of the week or as directed by your health care provider. Make sure to:  Do aerobic exercises, such as walking, jogging, biking, or swimming.  Do exercises that increase balance and strength, such as tai chi and yoga. These can decrease your risk of falling and injuring your back.  Do stretching exercises to help with  flexibility.  Try to develop strong abdominal muscles. Your abdominal muscles provide a lot of the support that is needed by your back.  Maintain a healthy weight. This helps to decrease your risk of a back injury. WHAT SHOULD I KNOW ABOUT MY DIET?  Talk with your health care provider about your overall diet. Take supplements and vitamins only as directed by your health care provider.  Talk with your health care provider about how much calcium and vitamin D you need each day. These nutrients help to prevent weakening of the bones (osteoporosis). Osteoporosis can cause broken (fractured) bones, which lead to back pain.  Include good sources of calcium in your diet, such as dairy products, green leafy vegetables, and products that have had calcium added to them (fortified).  Include good sources of vitamin D in your diet, such as milk and foods that are fortified with vitamin D. WHAT SHOULD I KNOW ABOUT MY POSTURE?  Sit up straight and stand up straight. Avoid leaning forward when you sit or hunching over when you stand.  Choose chairs that have good low-back (lumbar) support.  If you work at a desk, sit close to it so you do not need to lean over. Keep your chin tucked in. Keep your neck drawn back, and keep your elbows bent at a right angle. Your arms should look like the letter "L."  Sit high and close to the steering wheel when you drive. Add a lumbar support to your car seat, if needed.  Avoid sitting or standing in  one position for very long. Take breaks to get up, stretch, and walk around at least one time every hour. Take breaks every hour if you are driving for long periods of time.  Sleep on your side with your knees slightly bent, or sleep on your back with a pillow under your knees. Do not lie on the front of your body to sleep. WHAT SHOULD I KNOW ABOUT LIFTING, TWISTING, AND REACHING? Lifting and Heavy Lifting  Avoid heavy lifting, especially repetitive heavy lifting. If you  must do heavy lifting:  Stretch before lifting.  Work slowly.  Rest between lifts.  Use a tool such as a cart or a dolly to move objects if one is available.  Make several small trips instead of carrying one heavy load.  Ask for help when you need it, especially when moving big objects.  Follow these steps when lifting:  Stand with your feet shoulder-width apart.  Get as close to the object as you can. Do not try to pick up a heavy object that is far from your body.  Use handles or lifting straps if they are available.  Bend at your knees. Squat down, but keep your heels off the floor.  Keep your shoulders pulled back, your chin tucked in, and your back straight.  Lift the object slowly while you tighten the muscles in your legs, abdomen, and buttocks. Keep the object as close to the center of your body as possible.  Follow these steps when putting down a heavy load:  Stand with your feet shoulder-width apart.  Lower the object slowly while you tighten the muscles in your legs, abdomen, and buttocks. Keep the object as close to the center of your body as possible.  Keep your shoulders pulled back, your chin tucked in, and your back straight.  Bend at your knees. Squat down, but keep your heels off the floor.  Use handles or lifting straps if they are available. Twisting and Reaching  Avoid lifting heavy objects above your waist.  Do not twist at your waist while you are lifting or carrying a load. If you need to turn, move your feet.  Do not bend over without bending at your knees.  Avoid reaching over your head, across a table, or for an object on a high surface. WHAT ARE SOME OTHER TIPS?  Avoid wet floors and icy ground. Keep sidewalks clear of ice to prevent falls.  Do not sleep on a mattress that is too soft or too hard.  Keep items that are used frequently within easy reach.  Put heavier objects on shelves at waist level, and put lighter objects on lower  or higher shelves.  Find ways to decrease your stress, such as exercise, massage, or relaxation techniques. Stress can build up in your muscles. Tense muscles are more vulnerable to injury.  Talk with your health care provider if you feel anxious or depressed. These conditions can make back pain worse.  Wear flat heel shoes with cushioned soles.  Avoid sudden movements.  Use both shoulder straps when carrying a backpack.  Do not use any tobacco products, including cigarettes, chewing tobacco, or electronic cigarettes. If you need help quitting, ask your health care provider.   This information is not intended to replace advice given to you by your health care provider. Make sure you discuss any questions you have with your health care provider.   Document Released: 02/27/2004 Document Revised: 06/05/2014 Document Reviewed: 01/23/2014 Elsevier Interactive Patient Education 2016 [employer]  Lumbosacral Strain Lumbosacral strain is a strain of any of the parts that make up your lumbosacral vertebrae. Your lumbosacral vertebrae are the bones that make up the lower third of your backbone. Your lumbosacral vertebrae are held together by muscles and tough, fibrous tissue (ligaments).  CAUSES  A sudden blow to your back can cause lumbosacral strain. Also, anything that causes an excessive stretch of the muscles in the low back can cause this strain. This is typically seen when people exert themselves strenuously, fall, lift heavy objects, bend, or crouch repeatedly. RISK FACTORS  Physically demanding work.  Participation in pushing or pulling sports or sports that require a sudden twist of the back (tennis, golf, baseball).  Weight lifting.  Excessive lower back curvature.  Forward-tilted pelvis.  Weak back or abdominal muscles or both.  Tight hamstrings. SIGNS AND SYMPTOMS  Lumbosacral strain may cause pain in the area of your injury or pain that moves (radiates) down your leg.   DIAGNOSIS Your health care provider can often diagnose lumbosacral strain through a physical exam. In some cases, you may need tests such as X-ray exams.  TREATMENT  Treatment for your lower back injury depends on many factors that your clinician will have to evaluate. However, most treatment will include the use of anti-inflammatory medicines. HOME CARE INSTRUCTIONS   Avoid hard physical activities (tennis, racquetball, waterskiing) if you are not in proper physical condition for it. This may aggravate or create problems.  If you have a back problem, avoid sports requiring sudden body movements. Swimming and walking are generally safer activities.  Maintain good posture.  Maintain a healthy weight.  For acute conditions, you may put ice on the injured area.  Put ice in a plastic bag.  Place a towel between your skin and the bag.  Leave the ice on for 20 minutes, 2-3 times a day.  When the low back starts healing, stretching and strengthening exercises may be recommended. SEEK MEDICAL CARE IF:  Your back pain is getting worse.  You experience severe back pain not relieved with medicines. SEEK IMMEDIATE MEDICAL CARE IF:   You have numbness, tingling, weakness, or problems with the use of your arms or legs.  There is a change in bowel or bladder control.  You have increasing pain in any area of the body, including your belly (abdomen).  You notice shortness of breath, dizziness, or feel faint.  You feel sick to your stomach (nauseous), are throwing up (vomiting), or become sweaty.  You notice discoloration of your toes or legs, or your feet get very cold. MAKE SURE YOU:   Understand these instructions.  Will watch your condition.  Will get help right away if you are not doing well or get worse.   This information is not intended to replace advice given to you by your health care provider. Make sure you discuss any questions you have with your health care provider.    Document Released: 10/29/2004 Document Revised: 02/09/2014 Document Reviewed: 09/07/2012 Elsevier Interactive Patient Education 2016 [employer].  Mid-Back Strain With Rehab  A strain is an injury in which a tendon or muscle is torn. The muscles and tendons of the mid-back are vulnerable to strains. However, these muscles and tendons are very strong and require a great force to be injured. The muscles of the mid-back are responsible for stabilizing the spinal column, as well as spinal twisting (rotation). Strains are classified into three categories. Grade 1 strains cause pain, but the tendon  is not lengthened. Grade 2 strains include a lengthened ligament, due to the ligament being stretched or partially ruptured. With grade 2 strains there is still function, although the function may be decreased. Grade 3 strains involve a complete tear of the tendon or muscle, and function is usually impaired. SYMPTOMS   Pain in the middle of the back.  Pain that may affect only one side, and is worse with movement.  Muscle spasms, and often swelling in the back.  Loss of strength of the back muscles.  Crackling sound (crepitation) when the muscles are touched. CAUSES  Mid-back strains occur when a force is placed on the muscles or tendons that is greater than they can handle. Common causes of injury include:  Ongoing overuse of the muscle-tendon units in the middle back, usually from incorrect body posture.  A single violent injury or force applied to the back. RISK INCREASES WITH:  Sports that involve twisting forces on the spine or a lot of bending at the waist (football, rugby, weightlifting, bowling, golf, tennis, speed skating, racquetball, swimming, running, gymnastics, diving).  Poor strength and flexibility.  Failure to warm up properly before activity.  Family history of low back pain or disk disorders.  Previous back injury or surgery (especially fusion). PREVENTION  Learn and use  proper sports technique.  Warm up and stretch properly before activity.  Allow for adequate recovery between workouts.  Maintain physical fitness:  Strength, flexibility, and endurance.  Cardiovascular fitness. PROGNOSIS  If treated properly, mid-back strains usually heal within 6 weeks. RELATED COMPLICATIONS   Frequently recurring symptoms, resulting in a chronic problem. Properly treating the problem the first time decreases frequency of recurrence.  Chronic inflammation, scarring, and partial muscle-tendon tear.  Delayed healing or resolution of symptoms, especially if activity is resumed too soon.  Prolonged disability. TREATMENT Treatment first involves the use of ice and medicine, to reduce pain and inflammation. As the pain begins to subside, you may begin strengthening and stretching exercises to improve body posture and sport technique. These exercises may be performed at home or with a therapist. Severe injuries may require referral to a therapist for further evaluation and treatment, such as ultrasound. Corticosteroid injections may be given to help reduce inflammation. Biofeedback (watching monitors of your body processes) and psychotherapy may also be prescribed. Prolonged bed rest is felt to do more harm than good. Massage may help break the muscle spasms. Sometimes, an injection of cortisone, with or without local anesthetics, may be given to help relieve the pain and spasms. MEDICATION   If pain medicine is needed, nonsteroidal anti-inflammatory medicines (aspirin and ibuprofen), or other minor pain relievers (acetaminophen), are often advised.  Do not take pain medicine for 7 days before surgery.  Prescription pain relievers may be given, if your caregiver thinks they are needed. Use only as directed and only as much as you need.  Ointments applied to the skin may be helpful.  Corticosteroid injections may be given by your caregiver. These injections should be  reserved for the most serious cases, because they may only be given a certain number of times. HEAT AND COLD:   Cold treatment (icing) should be applied for 10 to 15 minutes every 2 to 3 hours for inflammation and pain, and immediately after activity that aggravates your symptoms. Use ice packs or an ice massage.  Heat treatment may be used before performing stretching and strengthening activities prescribed by your caregiver, physical therapist, or athletic trainer. Use a heat pack or  a warm water soak. SEEK IMMEDIATE MEDICAL CARE IF:  Symptoms get worse or do not improve in 2 to 4 weeks, despite treatment.  You develop numbness, weakness, or loss of bowel or bladder function.  New, unexplained symptoms develop. (Drugs used in treatment may produce side effects.) EXERCISES RANGE OF MOTION (ROM) AND STRETCHING EXERCISES - Mid-Back Strain These exercises may help you when beginning to rehabilitate your injury. In order to successfully resolve your symptoms, you must improve your posture. These exercises are designed to help reduce the forward-head and rounded-shoulder posture which contributes to this condition. Your symptoms may resolve with or without further involvement from your physician, physical therapist or athletic trainer. While completing these exercises, remember:   Restoring tissue flexibility helps normal motion to return to the joints. This allows healthier, less painful movement and activity.  An effective stretch should be held for at least 30 seconds.  A stretch should never be painful. You should only feel a gentle lengthening or release in the stretched tissue. STRETCH - Axial Extension  Stand or sit on a firm surface. Assume a good posture: chest up, shoulders drawn back, stomach muscles slightly tense, knees unlocked (if standing) and feet hip width apart.  Slowly retract your chin, so your head slides back and your chin slightly lowers. Continue to look straight  ahead.  You should feel a gentle stretch in the back of your head. Be certain not to feel an aggressive stretch since this can cause headaches later.  Hold for __________ seconds. Repeat __________ times. Complete this exercise __________ times per day. RANGE OF MOTION- Upper Thoracic Extension  Sit on a firm chair with a high back. Assume a good posture: chest up, shoulders drawn back, abdominal muscles slightly tense, and feet hip width apart. Place a small pillow or folded towel in the curve of your lower back, if you are having difficulty maintaining good posture.  Gently brace your neck with your hands, allowing your arms to rest on your chest.  Continue to support your neck and slowly extend your back over the chair. You will feel a stretch across your upper back.  Hold __________ seconds. Slowly return to the starting position. Repeat __________ times. Complete this exercise __________ times per day. RANGE OF MOTION- Mid-Thoracic Extension  Roll a towel so that it is about 4 inches in diameter.  Position the towel lengthwise. Lay on the towel so that your spine, but not your shoulder blades, are supported.  You should feel your mid-back arching toward the floor. To increase the stretch, extend your arms away from your body.  Hold for __________ seconds. Repeat exercise __________ times, __________ times per day. STRENGTHENING EXERCISES - Mid-Back Strain These exercises may help you when beginning to rehabilitate your injury. They may resolve your symptoms with or without further involvement from your physician, physical therapist or athletic trainer. While completing these exercises, remember:   Muscles can gain both the endurance and the strength needed for everyday activities through controlled exercises.  Complete these exercises as instructed by your physician, physical therapist or athletic trainer. Increase the resistance and repetitions only as guided by your  caregiver.  You may experience muscle soreness or fatigue, but the pain or discomfort you are trying to eliminate should never worsen during these exercises. If this pain does worsen, stop and make certain you are following the directions exactly. If the pain is still present after adjustments, discontinue the exercise until you can discuss the trouble with your  caregiver. STRENGTHENING - Quadruped, Opposite UE/LE Lift  Assume a hands and knees position on a firm surface. Keep your hands under your shoulders and your knees under your hips. You may place padding under your knees for comfort.  Find your neutral spine and gently tense your abdominal muscles so that you can maintain this position. Your shoulders and hips should form a rectangle that is parallel with the floor and is not twisted.  Keeping your trunk steady, lift your right hand no higher than your shoulder and then your left leg no higher than your hip. Make sure you are not holding your breath. Hold this position __________ seconds.  Continuing to keep your abdominal muscles tense and your back steady, slowly return to your starting position. Repeat with the opposite arm and leg. Repeat __________ times. Complete this exercise __________ times per day.  STRENGTH - Shoulder Extensors  Secure a rubber exercise band or tubing to a fixed object (table, pole) so that it is at the height of your shoulders when you are either standing, or sitting on a firm armless chair.  With a thumbs-up grip, grasp an end of the band in each hand. Straighten your elbows and lift your hands straight in front of you at shoulder height. Step back away from the secured end of band, until it becomes tense.  Squeezing your shoulder blades together, pull your hands down to the sides of your thighs. Do not allow your hands to go behind you.  Hold for __________ seconds. Slowly ease the tension on the band, as you reverse the directions and return to the starting  position. Repeat __________ times. Complete this exercise __________ times per day.  STRENGTH - Horizontal Abductors Choose one of the two positions to complete this exercise. Prone: lying on stomach:  Lie on your stomach on a firm surface so that your right / left arm overhangs the edge. Rest your forehead on your opposite forearm. With your palm facing the floor and your elbow straight, hold a __________ weight in your hand.  Squeeze your right / left shoulder blade to your mid-back spine and then slowly raise your arm to the height of the bed.  Hold for __________ seconds. Slowly reverse the directions and return to the starting position, controlling the weight as you lower your arm. Repeat __________ times. Complete this exercise __________ times per day. Standing:   Secure a rubber exercise band or tubing, so that it is at the height of your shoulders when you are either standing, or sitting on a firm armless chair.  Grasp an end of the band in each hand and have your palms face each other. Straighten your elbows and lift your hands straight in front of you at shoulder height. Step back away from the secured end of band, until it becomes tense.  Squeeze your shoulder blades together. Keeping your elbows locked and your hands at shoulder height, spread your arms apart, forming a "T" shape with your body. Hold __________ seconds. Slowly ease the tension on the band, as you reverse the directions and return to the starting position. Repeat __________ times. Complete this exercise __________ times per day. STRENGTH - Scapular Retractors and External Rotators, Rowing  Secure a rubber exercise band or tubing, so that it is at the height of your shoulders when you are either standing, or sitting on a firm armless chair.  With a palm-down grip, grasp an end of the band in each hand. Straighten your elbows and lift  your hands straight in front of you at shoulder height. Step back away from the  secured end of band, until it becomes tense.  Step 1: Squeeze your shoulder blades together. Bending your elbows, draw your hands to your chest as if you are rowing a boat. At the end of this motion, your hands and elbow should be at shoulder height and your elbows should be out to your sides.  Step 2: Rotate your shoulder to raise your hands above your head. Your forearms should be vertical and your upper arms should be horizontal.  Hold for __________ seconds. Slowly ease the tension on the band, as you reverse the directions and return to the starting position. Repeat __________ times. Complete this exercise __________ times per day.  POSTURE AND BODY MECHANICS CONSIDERATIONS - Mid-Back Strain Keeping correct posture when sitting, standing or completing your activities will reduce the stress put on different body tissues, allowing injured tissues a chance to heal and limiting painful experiences. The following are general guidelines for improved posture. Your physician or physical therapist will provide you with any instructions specific to your needs. While reading these guidelines, remember:  The exercises prescribed by your provider will help you have the flexibility and strength to maintain correct postures.  The correct posture provides the best environment for your joints to work. All of your joints have less wear and tear when properly supported by a spine with good posture. This means you will experience a healthier, less painful body.  Correct posture must be practiced with all of your activities, especially prolonged sitting and standing. Correct posture is as important when doing repetitive low-stress activities (typing) as it is when doing a single heavy-load activity (lifting). PROPER SITTING POSTURE In order to minimize stress and discomfort on your spine, you must sit with correct posture. Sitting with good posture should be effortless for a healthy body. Returning to good posture  is a gradual process. Many people can work toward this most comfortably by using various supports until they have the flexibility and strength to maintain this posture on their own. When sitting with proper posture, your ears will fall over your shoulders and your shoulders will fall over your hips. You should use the back of the chair to support your upper back. Your lower back will be in a neutral position, just slightly arched. You may place a small pillow or folded towel at the base of your low back for  support.  When working at a desk, create an environment that supports good, upright posture. Without extra support, muscles fatigue and lead to excessive strain on joints and other tissues. Keep these recommendations in mind: CHAIR:  A chair should be able to slide under your desk when your back makes contact with the back of the chair. This allows you to work closely.  The chair's height should allow your eyes to be level with the upper part of your monitor and your hands to be slightly lower than your elbows. BODY POSITION  Your feet should make contact with the floor. If this is not possible, use a foot rest.  Keep your ears over your shoulders. This will reduce stress on your neck and lower back. INCORRECT SITTING POSTURES If you are feeling tired and unable to assume a healthy sitting posture, do not slouch or slump. This puts excessive strain on your back tissues, causing more damage and pain. Healthier options include:  Using more support, like a lumbar pillow.  Switching tasks to  something that requires you to be upright or walking.  Talking a brief walk.  Lying down to rest in a neutral-spine position. CORRECT STANDING POSTURES Proper standing posture should be assumed with all daily activities, even if they only take a few moments, like when brushing your teeth. As in sitting, your ears should fall over your shoulders and your shoulders should fall over your hips. You should  keep a slight tension in your abdominal muscles to brace your spine. Your tailbone should point down to the ground, not behind your body, resulting in an over-extended swayback posture.  INCORRECT STANDING POSTURES Common incorrect standing postures include a forward head, locked knees, and an excessive swayback. WALKING Walk with an upright posture. Your ears, shoulders and hips should all line-up. CORRECT LIFTING TECHNIQUES DO :   Assume a wide stance. This will provide you more stability and the opportunity to get as close as possible to the object which you are lifting.  Tense your abdominals to brace your spine. Bend at the knees and hips. Keeping your back locked in a neutral-spine position, lift using your leg muscles. Lift with your legs, keeping your back straight.  Test the weight of unknown objects before attempting to lift them.  Try to keep your elbows locked down at your sides in order get the best strength from your shoulders when carrying an object.  Always ask for help when lifting heavy or awkward objects. INCORRECT LIFTING TECHNIQUES DO NOT:   Lock your knees when lifting, even if it is a small object.  Bend and twist. Pivot at your feet or move your feet when needing to change directions.  Assume that you can safely pick up even a paperclip without proper posture.   This information is not intended to replace advice given to you by your health care provider. Make sure you discuss any questions you have with your health care provider.   Document Released: 01/19/2005 Document Revised: 06/05/2014 Document Reviewed: 05/03/2008 Elsevier Interactive Patient Education [employer].

## 2015-05-31 NOTE — ED Provider Notes (Signed)
[facility] Emergency Department Provider Note  ____________________________________________  Time seen: 11:30 AM  I have reviewed the triage vital signs and the nursing notes.   HISTORY  Chief Complaint Shoulder Pain    HPI Jonathan Griffin is a 50 y.o. male who complains of right upper and lower back pain for the past 3 days. It is been constant, worse with movement and lifting heavy objects. Not worse with exertion. Seems to be affected by changes in position as well. 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 It hurts when he takes a deep breath. He does not have shortness of breath. No recent travel trauma hospitalizations or surgeries. No history of DVT or PE. No vomiting or diaphoresis or dizziness.     Past Medical History  Diagnosis Date  . Diabetes mellitus without complication (HCC)   . Hypertension   . Hyperlipemia      Patient Active Problem List   Diagnosis Date Noted  . Sleep apnea 04/09/2015  . Extreme obesity (HCC) 04/09/2015  . Hyperlipidemia 04/04/2015  . Type 2 diabetes mellitus without complications (HCC) 04/04/2015  . Hypertension 04/04/2015     Past Surgical History  Procedure Laterality Date  . Lipoma excision      neck     Current Outpatient Rx  Name  Route  Sig  Dispense  Refill  . atorvastatin (LIPITOR) 20 MG tablet   Oral   Take 1 tablet (20 mg total) by mouth daily.   90 tablet   1   . lisinopril (PRINIVIL,ZESTRIL) 10 MG tablet   Oral   Take 1 tablet (10 mg total) by mouth daily.   90 tablet   3   . metFORMIN (GLUCOPHAGE) 500 MG tablet   Oral   Take 1 tablet (500 mg total) by mouth 2 (two) times daily with a meal.   180 tablet   1   . diazepam (VALIUM) 5 MG tablet   Oral   Take 1 tablet (5 mg total) by mouth every 8 (eight) hours as needed for muscle spasms.   4 tablet   0   . naproxen (NAPROSYN) 500 MG tablet   Oral   Take 1  tablet (500 mg total) by mouth 2 (two) times daily with a meal.   20 tablet   0      Allergies Review of patient's allergies indicates no known allergies.   Family History  Problem Relation Age of Onset  . Diabetes Father   . Hypertension Father     Social History Social History  Substance Use Topics  . Smoking status: Never Smoker   . Smokeless tobacco: Former Neurosurgeon  . Alcohol Use: No  Smokes a quarter pack per day Drinks 6-12 beers a day  Review of Systems  Constitutional:   No fever or chills.  Eyes:   No vision changes.  ENT:   No sore throat. No rhinorrhea. Cardiovascular:   No chest pain. Respiratory:   No dyspnea or cough. Gastrointestinal:   Negative for abdominal pain, vomiting and diarrhea.  Genitourinary:   Negative for dysuria or difficulty urinating. Musculoskeletal:   Back pain as above Neurological:   Negative for headaches 10-point ROS otherwise negative.  ____________________________________________   PHYSICAL EXAM:  VITAL SIGNS: ED Triage Vitals  Enc Vitals Group     BP 05/31/15 1124 145/83 mmHg     Pulse Rate 05/31/15 1124 67  Resp 05/31/15 1124 20     Temp 05/31/15 1124 98.6 F (37 C)     Temp Source 05/31/15 1124 Oral     SpO2 05/31/15 1124 97 %     Weight 05/31/15 1124 261 lb (118.389 kg)     Height 05/31/15 1124 5\' 8"  (1.727 m)     Head Cir --      Peak Flow --      Pain Score 05/31/15 1125 5     Pain Loc --      Pain Edu? --      Excl. in GC? --     Vital signs reviewed, nursing assessments reviewed.   Constitutional:   Alert and oriented. Well appearing and in no distress. Eyes:   No scleral icterus. No conjunctival pallor. PERRL. EOMI.  No nystagmus. ENT   Head:   Normocephalic and atraumatic.   Nose:   No congestion/rhinnorhea. No septal hematoma   Mouth/Throat:   MMM, no pharyngeal erythema. No peritonsillar mass.    Neck:   No stridor. No SubQ emphysema. No  meningismus. Hematological/Lymphatic/Immunilogical:   No cervical lymphadenopathy. Cardiovascular:   RRR. Symmetric bilateral radial and DP pulses.  No murmurs.  Respiratory:   Normal respiratory effort without tachypnea nor retractions. Breath sounds are clear and equal bilaterally. No wheezes/rales/rhonchi. Gastrointestinal:   Soft and nontender. Non distended. There is no CVA tenderness.  No rebound, rigidity, or guarding. Genitourinary:   deferred Musculoskeletal:   Tenderness in the musculature of the right upper and lower back. No bony tenderness, no midline spinal tenderness. Aggravated by rotation of the thorax to the left. This reproduces his pain.Marland Kitchen. Neurologic:   Normal speech and language.  CN 2-10 normal. Motor grossly intact. No gross focal neurologic deficits are appreciated.  Skin:    Skin is warm, dry and intact. No rash noted.  No petechiae, purpura, or bullae.  ____________________________________________    LABS (pertinent positives/negatives) (all labs ordered are listed, but only abnormal results are displayed) Labs Reviewed  COMPREHENSIVE METABOLIC PANEL - Abnormal; Notable for the following:    CO2 21 (*)    Glucose, Bld 148 (*)    AST 49 (*)    All other components within normal limits  LIPASE, BLOOD  TROPONIN I  CBC WITH DIFFERENTIAL/PLATELET   ____________________________________________   EKG  Interpreted by me Normal sinus rhythm rate of 69, normal axis intervals QRS ST segments .  There is an isolated T-wave inversion in lead 3 which is nonspecific  ____________________________________________    RADIOLOGY  Chest x-ray unremarkable  ____________________________________________   PROCEDURES   ____________________________________________   INITIAL IMPRESSION / ASSESSMENT AND PLAN / ED COURSE  Pertinent labs & imaging results that were available during my care of the patient were reviewed by me and considered in my medical decision  making (see chart for details).  Patient well appearing no acute distress. Presents with musculoskeletal back pain which is reproduced on exam. Due to his multiple risk factors for CAD and apparent alcoholism, we'll check labs and chest x-ray. EKG is unremarkable.  ----------------------------------------- 12:28 PM on 05/31/2015 -----------------------------------------  Workup negative.Considering the patient's symptoms, medical history, and physical examination today, I have low suspicion for ACS, PE, TAD, pneumothorax, carditis, mediastinitis, pneumonia, CHF, or sepsis.  Follow-up with primary care. NSAIDs and Valium for sleep. Return precautions given.     ____________________________________________   FINAL CLINICAL IMPRESSION(S) / ED DIAGNOSES  Final diagnoses:  Musculoskeletal pain  Strain of lumbar paraspinal muscle, initial encounter  Portions of this note were generated with dragon dictation software. Dictation errors may occur despite best attempts at proofreading.   Sharman Cheek, MD 05/31/15 1229

## 2015-05-31 NOTE — ED Notes (Signed)
Pt to ed with c/o right shoulder pain and right mid back pain and sob x 3 days.

## 2016-04-13 ENCOUNTER — Encounter: Payer: BLUE CROSS/BLUE SHIELD | Admitting: Unknown Physician Specialty

## 2016-05-04 ENCOUNTER — Encounter: Payer: Self-pay | Admitting: Unknown Physician Specialty

## 2016-05-04 ENCOUNTER — Ambulatory Visit (INDEPENDENT_AMBULATORY_CARE_PROVIDER_SITE_OTHER): Payer: BLUE CROSS/BLUE SHIELD | Admitting: Unknown Physician Specialty

## 2016-05-04 VITALS — BP 145/92 | HR 78 | Temp 98.5°F | Ht 64.1 in | Wt 252.4 lb

## 2016-05-04 DIAGNOSIS — E119 Type 2 diabetes mellitus without complications: Secondary | ICD-10-CM | POA: Diagnosis not present

## 2016-05-04 DIAGNOSIS — Z0000 Encounter for general adult medical examination without abnormal findings: Secondary | ICD-10-CM | POA: Diagnosis not present

## 2016-05-04 DIAGNOSIS — I10 Essential (primary) hypertension: Secondary | ICD-10-CM | POA: Diagnosis not present

## 2016-05-04 DIAGNOSIS — E782 Mixed hyperlipidemia: Secondary | ICD-10-CM | POA: Diagnosis not present

## 2016-05-04 DIAGNOSIS — E668 Other obesity: Secondary | ICD-10-CM

## 2016-05-04 DIAGNOSIS — Z6841 Body Mass Index (BMI) 40.0 and over, adult: Secondary | ICD-10-CM | POA: Diagnosis not present

## 2016-05-04 LAB — BAYER DCA HB A1C WAIVED: HB A1C: 12 % — AB (ref <7.0)

## 2016-05-04 MED ORDER — LISINOPRIL 10 MG PO TABS: 10.0000 mg | ORAL_TABLET | Freq: Every day | ORAL | 3 refills | Status: DC

## 2016-05-04 MED ORDER — METFORMIN HCL 500 MG PO TABS: 500.0000 mg | ORAL_TABLET | Freq: Two times a day (BID) | ORAL | 1 refills | Status: DC

## 2016-05-04 MED ORDER — ASPIRIN EC 81 MG PO TBEC: 81.0000 mg | DELAYED_RELEASE_TABLET | Freq: Every day | ORAL | Status: DC

## 2016-05-04 MED ORDER — BLOOD GLUCOSE METER KIT: PACK | 0 refills | Status: DC

## 2016-05-04 MED ORDER — GLIPIZIDE 5 MG PO TABS: 5.0000 mg | ORAL_TABLET | Freq: Two times a day (BID) | ORAL | 3 refills | Status: DC

## 2016-05-04 MED ORDER — ATORVASTATIN CALCIUM 20 MG PO TABS: 20.0000 mg | ORAL_TABLET | Freq: Every day | ORAL | 1 refills | Status: DC

## 2016-05-04 NOTE — Assessment & Plan Note (Signed)
Disussed weight loss

## 2016-05-04 NOTE — Progress Notes (Signed)
BP (!) 145/92 (BP Location: Left Arm, Cuff Size: Large)   Pulse 78   Temp 98.5 F (36.9 C)   Ht 5' 4.1" (1.628 m)   Wt 252 lb 6.4 oz (114.5 kg)   SpO2 93%   BMI 43.19 kg/m    Subjective:    Patient ID: Jonathan Griffin, male    DOB: 04-26-1965, 51 y.o.   MRN: [id number]  HPI: Jonathan Griffin is a 51 y.o. male  Chief Complaint  Patient presents with  . Annual Exam  . Diabetes    pt states last eye exam was done last year at [facility], will fax form to them    Pt is lost to f/u and not taking any of his medications.    Diabetes: Not taking medications.  Lost to f/u No hypoglycemic episodes No hyperglycemic episodes Feet problems: No problems Blood Sugars averaging:Not checking eye exam within last year Last Hgb A1C: 10.2  Hypertension  Using medications without difficulty Average home BPs Not checking  Using medication without problems or lightheadedness No chest pain with exertion or shortness of breath No Edema  Elevated Cholesterol Using medications without problems No Muscle aches  Diet: Try to eat grilled foods.   Exercise: At work  Social History   Social History  . Marital status: Married    Spouse name: N/A  . Number of children: N/A  . Years of education: N/A   Occupational History  . Not on file.   Social History Main Topics  . Smoking status: Never Smoker  . Smokeless tobacco: Former Neurosurgeon  . Alcohol use No  . Drug use: No  . Sexual activity: No   Other Topics Concern  . Not on file   Social History Narrative  . No narrative on file   Family History  Problem Relation Age of Onset  . Diabetes Father   . Hypertension Father    Past Surgical History:  Procedure Laterality Date  . LIPOMA EXCISION     neck   Past Medical History:  Diagnosis Date  . Diabetes mellitus without complication (HCC)   . Hyperlipemia   . Hypertension      Relevant past medical, surgical, family and social history reviewed and updated as  indicated. Interim medical history since our last visit reviewed. Allergies and medications reviewed and updated.  Review of Systems  Per HPI unless specifically indicated above     Objective:    BP (!) 145/92 (BP Location: Left Arm, Cuff Size: Large)   Pulse 78   Temp 98.5 F (36.9 C)   Ht 5' 4.1" (1.628 m)   Wt 252 lb 6.4 oz (114.5 kg)   SpO2 93%   BMI 43.19 kg/m   Wt Readings from Last 3 Encounters:  05/04/16 252 lb 6.4 oz (114.5 kg)  05/31/15 261 lb (118.4 kg)  04/09/15 261 lb 9.6 oz (118.7 kg)    Physical Exam  Constitutional: He is oriented to person, place, and time. He appears well-developed and well-nourished.  HENT:  Head: Normocephalic.  Right Ear: Tympanic membrane, external ear and ear canal normal.  Left Ear: Tympanic membrane, external ear and ear canal normal.  Mouth/Throat: Uvula is midline, oropharynx is clear and moist and mucous membranes are normal.  Eyes: Pupils are equal, round, and reactive to light.  Cardiovascular: Normal rate, regular rhythm and normal heart sounds.  Exam reveals no gallop and no friction rub.   No murmur heard. Pulmonary/Chest: Effort normal and breath sounds  normal. No respiratory distress.  Abdominal: Soft. Bowel sounds are normal. He exhibits no distension. There is no tenderness.  Musculoskeletal: Normal range of motion.  Neurological: He is alert and oriented to person, place, and time. He has normal reflexes.  Skin: Skin is warm and dry.  Psychiatric: He has a normal mood and affect. His behavior is normal. Judgment and thought content normal.    Results for orders placed or performed during the hospital encounter of 05/31/15  Comprehensive metabolic panel  Result Value Ref Range   Sodium 140 135 - 145 mmol/L   Potassium 3.9 3.5 - 5.1 mmol/L   Chloride 109 101 - 111 mmol/L   CO2 21 (L) 22 - 32 mmol/L   Glucose, Bld 148 (H) 65 - 99 mg/dL   BUN 7 6 - 20 mg/dL   Creatinine, Ser 1.61 0.61 - 1.24 mg/dL   Calcium 8.9 8.9  - 09.6 mg/dL   Total Protein 7.7 6.5 - 8.1 g/dL   Albumin 4.2 3.5 - 5.0 g/dL   AST 49 (H) 15 - 41 U/L   ALT 36 17 - 63 U/L   Alkaline Phosphatase 98 38 - 126 U/L   Total Bilirubin 0.9 0.3 - 1.2 mg/dL   GFR calc non Af Amer >60 >60 mL/min   GFR calc Af Amer >60 >60 mL/min   Anion gap 10 5 - 15  Lipase, blood  Result Value Ref Range   Lipase 32 11 - 51 U/L  Troponin I  Result Value Ref Range   Troponin I <0.03 <0.031 ng/mL  CBC with Differential  Result Value Ref Range   WBC 5.4 3.8 - 10.6 K/uL   RBC 4.45 4.40 - 5.90 MIL/uL   Hemoglobin 14.4 13.0 - 18.0 g/dL   HCT 04.5 40.9 - 81.1 %   MCV 92.4 80.0 - 100.0 fL   MCH 32.4 26.0 - 34.0 pg   MCHC 35.0 32.0 - 36.0 g/dL   RDW 91.4 78.2 - 95.6 %   Platelets 171 150 - 440 K/uL   Neutrophils Relative % 47 %   Neutro Abs 2.6 1.4 - 6.5 K/uL   Lymphocytes Relative 36 %   Lymphs Abs 1.9 1.0 - 3.6 K/uL   Monocytes Relative 11 %   Monocytes Absolute 0.6 0.2 - 1.0 K/uL   Eosinophils Relative 5 %   Eosinophils Absolute 0.3 0 - 0.7 K/uL   Basophils Relative 1 %   Basophils Absolute 0.0 0 - 0.1 K/uL      Assessment & Plan:   Problem List Items Addressed This Visit      Unprioritized   Extreme obesity (HCC)    Disussed weight loss      Relevant Medications   metFORMIN (GLUCOPHAGE) 500 MG tablet   glipiZIDE (GLUCOTROL) 5 MG tablet   Hyperlipidemia    Restart Atorvastatin      Relevant Medications   aspirin EC 81 MG tablet   atorvastatin (LIPITOR) 20 MG tablet   lisinopril (PRINIVIL,ZESTRIL) 10 MG tablet   Other Relevant Orders   Lipid Panel w/o Chol/HDL Ratio   Hypertension    Not to goal.  Restart Lisinopril      Relevant Medications   aspirin EC 81 MG tablet   atorvastatin (LIPITOR) 20 MG tablet   lisinopril (PRINIVIL,ZESTRIL) 10 MG tablet   Type 2 diabetes mellitus without complications (HCC) - Primary    Very poor control.  Refer to to lifestyle center.  I need him to learn how to use  a meter to start insulin.   Restart on Metformin.  Send to lifestyle center to use glucose monitor.  Add Glipizide in the meantime.  Recheck in one month      Relevant Medications   aspirin EC 81 MG tablet   metFORMIN (GLUCOPHAGE) 500 MG tablet   glipiZIDE (GLUCOTROL) 5 MG tablet   atorvastatin (LIPITOR) 20 MG tablet   lisinopril (PRINIVIL,ZESTRIL) 10 MG tablet   Other Relevant Orders   Comprehensive metabolic panel   Bayer DCA Hb Z6X Waived    Other Visit Diagnoses    Routine general medical examination at a health care facility       Relevant Orders   PSA   CBC with Differential/Platelet   TSH       Follow up plan: Return in about 4 weeks (around 06/01/2016).

## 2016-05-04 NOTE — Patient Instructions (Addendum)
Diabetes mellitus tipo2 en los adultos, cuidados personales (Type 2 Diabetes Mellitus, Self Care, Adult) Las personas que tienen diabetes tipo2 (diabetes mellitus tipo2) deben Contractor nivel de glucosa en la sangre bajo control. Es posible hacerlo a travs de lo siguiente:  Nutricin.  Actividad fsica.  Cambios en el estilo de vida.  Medicamentos o insulina, si es necesario.  El 72 de los mdicos y de Producer, television/film/video. CMO ME CONTROLO EL NIVEL DE GLUCOSA EN LA SANGRE?  Contrlese la glucemia todos los [place], con la frecuencia que le hayan indicado.  Llame al mdico si el nivel de glucosa en la sangre est por encima de las cifras ideales en 2anlisis seguidos.  Contrlese el nivel de hemoglobina A1c al [employer] al ao. Realcese controles ms frecuentes si el mdico se lo indica. El mdico fijar los objetivos del tratamiento para usted. Generalmente, los resultados de los niveles de glucosa en la sangre deben ser los siguientes:  Antes de las comidas (preprandial): de 80 a 125m/dl (4,4 a 7,271ml/l).  Despus de las comidas (posprandial): menos de 18059ml (20m65ml).  Nivel de A1c: menos del 7%. QU DEBO SABER ACERCA DE LA GLUCEMIA ALTA? El nivel alto de glucosa en la sangre se denomina hiperglucemia. Conozca los signos de la hiperglucemia. Los signos pueden incluir lo siguiente:  Sentir que tiene lo siguiente:  Sed.  Apetito.  Mucho cansancio.  Necesidad de orinGarment/textile technologist mayor frecuencia que lo habitual.  Visin borrosa. QU DEBO SABER ACERCA DE LA GLUCEMIA BAJA? El nivel bajo de glucosa en la sangre se denomina hipoglucemia. Este cuadro ocurre cuando el nivel de glucosa en la sangre es igual o menor que 70mg73m(3,9mmol18m. Entre los sntomas se pueden incluir los siguientes:  Sentir que tiene lo siguiente:  Apetito.  Preocupacin o nervios (ansioso).  Sudoracin y piel hmeda.  Confusin.  Mareos.  Somnolencia.  Nuseas.  Tener  lo siguiente:  Latidos cardacos rpidos (palpitaciones).  Dolor de cabezaNetherlandsbios en la visin.  Una crisis de movimientos que no puede controlar (convulsiones).  Pesadillas.  Hormigueo y falta de sensibilidad (adormecimiento) alrededor de la boca, los labios o la lenguaPoint Blankicultades para hacer lo siguiente:  Hablar.  Prestar atencin (concentrarse).  Moverse (coordinacin).  Dormir.  Temblores.  Desmayos.  Molestarse con facilidad (irritabilidad). Tratamiento de la glucemia baja Para tratar la glucemia baja, ingiera un alimento o una bebida azucarada de inmediato. Si puede pensar con claridad y tragar de manera segura, siga la regla 15/15, que consiste en lo siguiente:  Consumir 15gramos de un hidrato de carbono de accin rpida. Algunos hidratos de carbono de accin rpida son los siguientes:  1pomo de glucosa en gel.  3comprimidos de azcar (comprimidos de glucosa).  6 a 8unidades de caramelos duros.  4onzas (120ml) 55mugo de frutas.  4onzas (120ml) d66mseosa comn (no diettica).  Contrlese la glucemia 15minuto77mspus de ingerir el hidrato de carbono.  Si el nivel de glucosa en la sangre todava es igual o menor que 70mg/dl (68mmol/l), 62miera nuevamente 15gramos de un hidrato de carbono.  Si el nivel de glucosa en la sangre no supera los 70mg/dl (3,40ml/l) des20m de 3intentos, solicite ayuda de inmediato.  Ingiera una comida o una colacin en el transcurso de 1hora despus de que la glucemia se haya normalizado. Tratamiento de la glucemia muy baja Si el nivel de glucosa en la sangre es igual o menor que 54mg/dl (3mmo77m, sign3mca que est muy bajo (hipoglucemia grave). Esto es una emergencia.Engineer, maintenance (IT)  No espere hasta que los sntomas desaparezcan. Solicite atencin mdica de inmediato. Comunquese con el servicio de emergencias de su localidad (911 en los [place]). No conduzca por sus propios medios [employer]. Si su  nivel de glucosa en la sangre muy bajo y no puede ingerir ningn alimento ni bebida, tal vez deba aplicarse una inyeccin de glucagn. Un familiar o un amigo deben aprender a controlarle la glucemia y a aplicarle una inyeccin de glucagn. Pregntele al mdico si debe tener un kit de inyecciones de glucagn en su casa. QU OTRAS COSAS SON IMPORTANTES PARA CONTROLAR LA DIABETES? Medicamentos Siga estas indicaciones respecto de la insulina y los medicamentos para la diabetes:  Tmelos como se lo haya indicado el mdico.  Ajstelos como se lo haya indicado el mdico.  No se quede sin medicamentos. La diabetes puede aumentar el riesgo de tener otras enfermedades a Barrister's clerk. Estas incluyen las cardiopatas coronarias o enfermedades renales. El mdico puede recetar medicamentos para evitar los problemas causados por la diabetes. Alimento  Opte por opciones de alimentos saludables. Estos incluyen los siguientes:  Pollo, pescado, claras de huevos y frijoles.  Avena, salvado, trigo burgol, arroz integral, quinua y mijo.  Lambert Mody y verduras frescas.  Productos lcteos descremados.  Frutos secos, aguacate, aceite de [place] y aceite de canola.  Arme un plan de alimentacin con un especialista (nutricionista).  Siga las indicaciones del mdico respecto de lo que no puede comer o beber.  Beba suficiente lquido para mantener el pis (orina) claro o de color amarillo plido.  Ingiera colaciones saludables entre comidas saludables.  Lleve un registro de los hidratos de carbono que consume. Lea las etiquetas de los alimentos. Conozca el tamao de las porciones de los alimentos.  Siga el plan para los das de enfermedad cuando no pueda comer o beber normalmente. Arme este plan con el mdico, de modo que est listo para usarlo. Actividad  Practique ejercicios al menos 3veces por semana.  No deje pasar ms de 2das sin hacer actividad fsica.  Hable con el mdico antes de comenzar un  ejercicio nuevo. Es posible que el mdico deba hacer ajustes en la [place], los medicamentos o los alimentos. Estilo de vida  No use productos que contengan tabaco. Estos incluyen cigarrillos, tabaco de Higher education careers adviser y cigarrillos electrnicos. Si necesita ayuda para dejar de fumar, consulte al mdico.  Pregntele a su mdico qu cantidad de alcohol puede tomar.  Aprenda a sobrellevar el estrs. Si necesita ayuda para lograrlo, consulte al [employer]. Cuidado del cuerpo  Mantngase al da con las vacunas.  Hgase controlar los ojos y los pies por un mdico con la frecuencia que le hayan indicado.  Revsese la piel y [employer]. Examine si hay cortes, moretones, enrojecimiento, ampollas o llagas.  Cepllese los dientes y [place].  Use el hilo dental al menos una vez al da.  Vaya al dentista al menos una vez cada 53mses.  Mantenga un peso saludable. Instrucciones generales  TDelphide venta libre y los recetados solamente como se lo haya indicado el mdico.  Comparta su plan de atencin de la diabetes con estas personas:  Compaeros de trabajo o de la escuela.  Aquellas con las que cRex  Hgase anlisis de orina para dProduct managerpresencia de cetonas:  Cuando est enfermo.  Como se lo haya indicado el mdico.  Lleve consigo una tarjeta, o use un brazalete o una medalla que indiquen que es diabtico.  Pregntele al mdico lo siguiente:  Debo reunirme con Radio broadcast assistant para el cuidado de la diabetes?  Dnde puedo encontrar un grupo de apoyo para personas diabticas?  Concurra a todas las visitas de control como se lo haya indicado el mdico. Esto es importante. DNDE ENCONTRAR MS INFORMACIN: Para obtener ms informacin sobre la diabetes, visite los siguientes sitios:  Asociacin Americana de la Diabetes (American Diabetes Association): www.diabetes.org  Asociacin Norteamericana de Instructores para el Cuidado de la  Diabetes (American Association of Diabetes Educators): www.diabeteseducator.org/patient-resources Esta informacin no tiene Marine scientist el consejo del mdico. Asegrese de hacerle al mdico cualquier pregunta que tenga. Document Released: 05/13/2015 Document Revised: 05/13/2015 Document Reviewed: 02/22/2015 Elsevier Interactive Patient Education  2017 Central diabetes mellitus y los alimentos (Diabetes Mellitus and Food) Es importante que controle su nivel de azcar en la sangre (glucosa). El nivel de glucosa en sangre depende en gran medida de lo que usted come. Comer alimentos saludables en las cantidades [place] a lo largo del Training and development officer, aproximadamente a la misma hora [employer], lo ayudar a Chief Technology Officer su nivel de Multimedia programmer. Tambin puede ayudarlo a retrasar o Patent attorney de la diabetes mellitus. Comer de [employer] saludable incluso puede ayudarlo a Chartered loss adjuster de presin arterial y a Science writer o Theatre manager un peso saludable. Entre las recomendaciones generales para alimentarse y Audiological scientist los alimentos de forma saludable, se incluyen las siguientes:  Respetar las comidas principales y comer colaciones con regularidad. Evitar pasar largos perodos sin comer con el fin de perder peso.  Seguir una dieta que consista principalmente en alimentos de origen vegetal, como frutas, vegetales, frutos secos, legumbres y cereales integrales.  Utilizar mtodos de coccin a baja temperatura, como hornear, en lugar de mtodos de coccin a alta temperatura, como frer en abundante aceite. Trabaje con el nutricionista para aprender a Financial planner nutricional de las etiquetas de los alimentos. CMO PUEDEN AFECTARME LOS ALIMENTOS? Carbohidratos Los carbohidratos afectan el nivel de glucosa en sangre ms que cualquier otro tipo de alimento. El nutricionista lo ayudar a Teacher, adult education cuntos carbohidratos puede consumir en cada comida y ensearle a contarlos. El recuento de  carbohidratos es importante para mantener la glucosa en sangre en un nivel saludable, en especial si utiliza insulina o toma determinados medicamentos para la diabetes mellitus. Alcohol El alcohol puede provocar disminuciones sbitas de la glucosa en sangre (hipoglucemia), en especial si utiliza insulina o toma determinados medicamentos para la diabetes mellitus. La hipoglucemia es una afeccin que puede poner en peligro la vida. Los sntomas de la hipoglucemia (somnolencia, mareos y Data processing manager) son similares a los sntomas de haber consumido mucho alcohol. Si el mdico lo autoriza a beber alcohol, hgalo con moderacin y siga estas pautas:  Las mujeres no deben beber ms de un trago por da, y los hombres no deben beber ms de dos tragos por Training and development officer. Un trago es igual a:  12 onzas (355 ml) de cerveza  5 onzas de vino (150 ml) de vino  1,5onzas (6m) de bebidas espirituosas  No beba con el estmago vaco.  Mantngase hidratado. Beba agua, gaseosas dietticas o t helado sin azcar.  Las gaseosas comunes, los jugos y otros refrescos podran contener muchos carbohidratos y se dCivil Service fast streamer QU ALIMENTOS NO SE RECOMIENDAN? Cuando haga las elecciones de alimentos, es importante que recuerde que todos los alimentos son distintos. Algunos tienen menos nutrientes que otros por porcin, aunque podran tener la misma cantidad de caloras o carbohidratos. Es difcil darle al  cuerpo lo que necesita cuando consume alimentos con menos nutrientes. Estos son algunos ejemplos de alimentos que debera evitar ya que contienen muchas caloras y carbohidratos, pero pocos nutrientes:  Physicist, medical trans (la mayora de los alimentos procesados incluyen grasas trans en la etiqueta de Informacin nutricional).  Gaseosas comunes.  Jugos.  Caramelos.  Dulces, como tortas, pasteles, rosquillas y [place].  Comidas fritas. QU ALIMENTOS PUEDO COMER? Consuma alimentos ricos en nutrientes, que nutrirn el cuerpo y  lo mantendrn saludable. Los alimentos que debe comer tambin dependern de varios factores, como:  Las caloras que necesita.  Los medicamentos que toma.  Su peso.  El nivel de glucosa en [place].  El [place] de presin arterial.  El nivel de colesterol. Debe consumir una amplia variedad de alimentos, por ejemplo:  Protenas.  Cortes de [employer].  Protenas con bajo contenido de grasas saturadas, como pescado, clara de huevo y frijoles. Evite las carnes procesadas.  Frutas y vegetales.  Frutas y Photographer que pueden ayudar a Chief Technology Officer los niveles sanguneos de [place], como [place], mangos y batatas.  Productos lcteos.  Elija productos lcteos sin grasa o con bajo contenido de [place], como [place], yogur y [place].  Cereales, panes, pastas y arroz.  Elija cereales integrales, como panes multicereales, avena en grano y arroz integral. Estos alimentos pueden ayudar a controlar la presin arterial.  Daphene Jaeger.  Alimentos que contengan grasas saludables, como frutos secos, Musician, aceite de [place], aceite de canola y pescado. TODOS LOS QUE PADECEN DIABETES MELLITUS TIENEN EL [place] PLAN DE [place]? Dado que todas las personas que padecen diabetes mellitus son distintas, no hay un solo plan de comidas que funcione para todos. Es muy importante que se rena con un nutricionista que lo ayudar a crear un plan de comidas adecuado para usted. Esta informacin no tiene Marine scientist el consejo del mdico. Asegrese de hacerle al mdico cualquier pregunta que tenga. Document Released: 04/28/2007 Document Revised: 02/09/2014 Document Reviewed: 12/16/2012 Elsevier Interactive Patient Education  2017 [employer].

## 2016-05-04 NOTE — Assessment & Plan Note (Addendum)
Not to goal.  Restart Lisinopril

## 2016-05-04 NOTE — Assessment & Plan Note (Signed)
Re-start Atorvastatin 

## 2016-05-04 NOTE — Assessment & Plan Note (Addendum)
Very poor control.  Refer to to lifestyle center.  I need him to learn how to use a meter to start insulin.  Restart on Metformin.  Send to lifestyle center to use glucose monitor.  Add Glipizide in the meantime.  Recheck in one month

## 2016-05-05 ENCOUNTER — Encounter: Payer: Self-pay | Admitting: Unknown Physician Specialty

## 2016-05-05 LAB — COMPREHENSIVE METABOLIC PANEL
ALK PHOS: 152 IU/L — AB (ref 39–117)
ALT: 30 IU/L (ref 0–44)
AST: 32 IU/L (ref 0–40)
Albumin/Globulin Ratio: 1.3 (ref 1.2–2.2)
Albumin: 4.2 g/dL (ref 3.5–5.5)
BILIRUBIN TOTAL: 0.8 mg/dL (ref 0.0–1.2)
BUN/Creatinine Ratio: 10 (ref 9–20)
BUN: 7 mg/dL (ref 6–24)
CHLORIDE: 100 mmol/L (ref 96–106)
CO2: 21 mmol/L (ref 18–29)
Calcium: 8.9 mg/dL (ref 8.7–10.2)
Creatinine, Ser: 0.7 mg/dL — ABNORMAL LOW (ref 0.76–1.27)
GFR calc Af Amer: 126 mL/min/{1.73_m2} (ref >59)
GFR calc non Af Amer: 109 mL/min/{1.73_m2} (ref >59)
GLUCOSE: 184 mg/dL — AB (ref 65–99)
Globulin, Total: 3.2 g/dL (ref 1.5–4.5)
Potassium: 3.9 mmol/L (ref 3.5–5.2)
SODIUM: 139 mmol/L (ref 134–144)
TOTAL PROTEIN: 7.4 g/dL (ref 6.0–8.5)

## 2016-05-05 LAB — LIPID PANEL W/O CHOL/HDL RATIO
CHOLESTEROL TOTAL: 192 mg/dL (ref 100–199)
HDL: 59 mg/dL (ref >39)
LDL CALC: 107 mg/dL — AB (ref 0–99)
TRIGLYCERIDES: 130 mg/dL (ref 0–149)
VLDL CHOLESTEROL CAL: 26 mg/dL (ref 5–40)

## 2016-05-05 LAB — CBC WITH DIFFERENTIAL/PLATELET
BASOS: 1 %
Basophils Absolute: 0 10*3/uL (ref 0.0–0.2)
EOS (ABSOLUTE): 0.2 10*3/uL (ref 0.0–0.4)
EOS: 4 %
HEMATOCRIT: 43.1 % (ref 37.5–51.0)
Hemoglobin: 14.3 g/dL (ref 13.0–17.7)
IMMATURE GRANS (ABS): 0 10*3/uL (ref 0.0–0.1)
IMMATURE GRANULOCYTES: 0 %
LYMPHS: 37 %
Lymphocytes Absolute: 2.3 10*3/uL (ref 0.7–3.1)
MCH: 31 pg (ref 26.6–33.0)
MCHC: 33.2 g/dL (ref 31.5–35.7)
MCV: 94 fL (ref 79–97)
Monocytes Absolute: 0.7 10*3/uL (ref 0.1–0.9)
Monocytes: 11 %
NEUTROS ABS: 2.9 10*3/uL (ref 1.4–7.0)
NEUTROS PCT: 47 %
PLATELETS: 199 10*3/uL (ref 150–379)
RBC: 4.61 x10E6/uL (ref 4.14–5.80)
RDW: 14.2 % (ref 12.3–15.4)
WBC: 6.3 10*3/uL (ref 3.4–10.8)

## 2016-05-05 LAB — TSH: TSH: 2.45 u[IU]/mL (ref 0.450–4.500)

## 2016-05-05 LAB — PSA: Prostate Specific Ag, Serum: 0.7 ng/mL (ref 0.0–4.0)

## 2016-06-02 ENCOUNTER — Ambulatory Visit: Payer: BLUE CROSS/BLUE SHIELD | Admitting: Unknown Physician Specialty

## 2017-02-24 LAB — HM DIABETES EYE EXAM

## 2017-03-30 ENCOUNTER — Encounter: Payer: Self-pay | Admitting: Intensive Care

## 2017-03-30 ENCOUNTER — Emergency Department: Payer: BLUE CROSS/BLUE SHIELD

## 2017-03-30 ENCOUNTER — Emergency Department
Admission: EM | Admit: 2017-03-30 | Discharge: 2017-03-30 | Disposition: A | Discharge status: Home / Self Care | Payer: BLUE CROSS/BLUE SHIELD | Attending: Emergency Medicine | Admitting: Emergency Medicine

## 2017-03-30 DIAGNOSIS — S20212A Contusion of left front wall of thorax, initial encounter: Secondary | ICD-10-CM | POA: Diagnosis not present

## 2017-03-30 DIAGNOSIS — Y990 Civilian activity done for income or pay: Secondary | ICD-10-CM | POA: Diagnosis not present

## 2017-03-30 DIAGNOSIS — F17210 Nicotine dependence, cigarettes, uncomplicated: Secondary | ICD-10-CM | POA: Insufficient documentation

## 2017-03-30 DIAGNOSIS — Z7982 Long term (current) use of aspirin: Secondary | ICD-10-CM | POA: Insufficient documentation

## 2017-03-30 DIAGNOSIS — S299XXA Unspecified injury of thorax, initial encounter: Secondary | ICD-10-CM | POA: Diagnosis present

## 2017-03-30 DIAGNOSIS — E119 Type 2 diabetes mellitus without complications: Secondary | ICD-10-CM | POA: Diagnosis not present

## 2017-03-30 DIAGNOSIS — I10 Essential (primary) hypertension: Secondary | ICD-10-CM | POA: Insufficient documentation

## 2017-03-30 DIAGNOSIS — Y9389 Activity, other specified: Secondary | ICD-10-CM | POA: Diagnosis not present

## 2017-03-30 DIAGNOSIS — Y929 Unspecified place or not applicable: Secondary | ICD-10-CM | POA: Diagnosis not present

## 2017-03-30 DIAGNOSIS — Z7984 Long term (current) use of oral hypoglycemic drugs: Secondary | ICD-10-CM | POA: Insufficient documentation

## 2017-03-30 DIAGNOSIS — W208XXA Other cause of strike by thrown, projected or falling object, initial encounter: Secondary | ICD-10-CM | POA: Diagnosis not present

## 2017-03-30 DIAGNOSIS — Z79899 Other long term (current) drug therapy: Secondary | ICD-10-CM | POA: Insufficient documentation

## 2017-03-30 MED ORDER — NAPROXEN 500 MG PO TABS: 500.0000 mg | ORAL_TABLET | Freq: Two times a day (BID) | ORAL | 0 refills | Status: DC

## 2017-03-30 NOTE — Discharge Instructions (Signed)
Follow-up with your primary care doctor or the doctor that is specified for Workmen's Comp. injuries.  You may continue putting ice on your ribs as needed for pain.  Begin taking naproxen 500 mg twice daily with food.  Take the note to your supervisor for light duty for the next 2 days.

## 2017-03-30 NOTE — ED Triage Notes (Signed)
Patient reports he was unloading boxes and a box fell on L rib cage last Wednesday and he has been in pain ever since. Ambulatory with no problems. No respiratory distress

## 2017-03-30 NOTE — ED Notes (Addendum)
See triage note  States he fell last weds.  Landed on left rib area  conts to have pain  States he was unloading boxes

## 2017-03-30 NOTE — ED Provider Notes (Signed)
[facility] Emergency Department Provider Note  ____________________________________________   First MD Initiated Contact with Patient 03/30/17 1119     (approximate)  I have reviewed the triage vital signs and the nursing notes.   HISTORY  Chief Complaint No chief complaint on file.   HPI Jonathan Griffin is a 52 y.o. male complaint of left anterior chest wall pain after a 50 pound box of socks fell on him while he was at work.  Patient states that the blood was hard enough that he fail backwards on his buttocks.  He denies any head injury.  He has continued to have pain with range of motion but is not taking any over-the-counter medication.  This is his initial evaluation for this injury.  He rates his pain as an 8 out of 10.   Past Medical History:  Diagnosis Date  . Diabetes mellitus without complication ([place])   . Hyperlipemia   . Hypertension     Patient Active Problem List   Diagnosis Date Noted  . Sleep apnea 04/09/2015  . Extreme obesity 04/09/2015  . Hyperlipidemia 04/04/2015  . Type 2 diabetes mellitus without complications ([place]) 83/38/2505  . Hypertension 04/04/2015    Past Surgical History:  Procedure Laterality Date  . LIPOMA EXCISION     neck    Prior to Admission medications   Medication Sig Start Date End Date Taking? Authorizing Provider  aspirin EC 81 MG tablet Take 1 tablet (81 mg total) by mouth daily. 05/04/16   Kathrine Haddock, NP  atorvastatin (LIPITOR) 20 MG tablet Take 1 tablet (20 mg total) by mouth daily. 05/04/16   Kathrine Haddock, NP  blood glucose meter kit and supplies Dispense based on patient and insurance preference. Use up to four times daily as directed. (FOR ICD-9 250.00, 250.01). 05/04/16   Kathrine Haddock, NP  glipiZIDE (GLUCOTROL) 5 MG tablet Take 1 tablet (5 mg total) by mouth 2 (two) times daily before a meal. 05/04/16   Kathrine Haddock, NP  lisinopril (PRINIVIL,ZESTRIL) 10 MG tablet Take 1 tablet (10 mg total) by  mouth daily. 05/04/16   Kathrine Haddock, NP  metFORMIN (GLUCOPHAGE) 500 MG tablet Take 1 tablet (500 mg total) by mouth 2 (two) times daily with a meal. 05/04/16   Kathrine Haddock, NP  naproxen (NAPROSYN) 500 MG tablet Take 1 tablet (500 mg total) by mouth 2 (two) times daily with a meal. 03/30/17   Johnn Hai, PA-C    Allergies Patient has no known allergies.  Family History  Problem Relation Age of Onset  . Diabetes Father   . Hypertension Father     Social History Social History   Tobacco Use  . Smoking status: Current Every Day Smoker    Types: Cigarettes  . Smokeless tobacco: Former Network engineer Use Topics  . Alcohol use: Yes    Alcohol/week: 8.4 oz    Types: 14 Cans of beer per week  . Drug use: No    Review of Systems Constitutional: No fever/chills Cardiovascular: Denies chest pain. Respiratory: Denies shortness of breath. Gastrointestinal: No abdominal pain.  No nausea, no vomiting. Musculoskeletal: Positive for chest wall pain. Skin: Negative for rash. Neurological: Negative for headaches, focal weakness or numbness. ___________________________________________   PHYSICAL EXAM:  VITAL SIGNS: ED Triage Vitals [03/30/17 1010]  Enc Vitals Group     BP (!) 156/79     Pulse Rate 83     Resp 16     Temp 98 F (36.7 C)  Temp Source Oral     SpO2 98 %     Weight 260 lb (117.9 kg)     Height _0  (1.651 m)     Head Circumference      Peak Flow      Pain Score 8     Pain Loc      Pain Edu?      Excl. in [place]?    Constitutional: Alert and oriented. Well appearing and in no acute distress. Eyes: Conjunctivae are normal.  Head: Atraumatic. Neck: No stridor.   Cardiovascular: Normal rate, regular rhythm. Grossly normal heart sounds.  Good peripheral circulation. Respiratory: Normal respiratory effort.  No retractions. Lungs CTAB.  There is tenderness on palpation of the left lateral sixth, seventh, eighth rib without soft tissue swelling or deformity  noted. Gastrointestinal: Soft and nontender. No distention.  Musculoskeletal: Moves upper and lower extremities without any difficulty.  Normal gait was noted. Neurologic:  Normal speech and language. No gross focal neurologic deficits are appreciated. Skin:  Skin is warm, dry and intact.  No ecchymosis or abrasions seen. Psychiatric: Mood and affect are normal. Speech and behavior are normal.  ____________________________________________   LABS (all labs ordered are listed, but only abnormal results are displayed)  Labs Reviewed - No data to display  RADIOLOGY  ED MD interpretation:   Left rib x-ray no fractures were identified.  Official radiology report(s): Dg Ribs Unilateral W/chest Left  Result Date: 03/30/2017 CLINICAL DATA:  Pain after box fell on chest EXAM: LEFT RIBS AND CHEST - 3+ VIEW COMPARISON:  Chest radiograph May 31, 2015 FINDINGS: Frontal chest as well as oblique and cone-down rib images were obtained. There is no edema or consolidation. Heart size and pulmonary vascularity are normal. No adenopathy. No evident pleural effusion or pneumothorax. No rib fracture evident. IMPRESSION: No evident rib fracture.  No edema or consolidation. Electronically Signed   By: Lowella Grip III M.D.   On: 03/30/2017 10:48    ____________________________________________   PROCEDURES  Procedure(s) performed: None  Procedures  Critical Care performed: No  ____________________________________________   INITIAL IMPRESSION / ASSESSMENT AND PLAN / ED COURSE  Patient was reassured that x-ray did not show a fracture.  Because his Workmen's Comp. patient was placed on light duty for the next 2 days and started on a prescription for naproxen 500 mg twice daily with food.  He is to follow-up with company doctor or his PCP if any continued problems.  ____________________________________________   FINAL CLINICAL IMPRESSION(S) / ED DIAGNOSES  Final diagnoses:  Contusion of  ribs, left, initial encounter     ED Discharge Orders        Ordered    naproxen (NAPROSYN) 500 MG tablet  2 times daily with meals     03/30/17 1204       Note:  This document was prepared using Dragon voice recognition software and may include unintentional dictation errors.    Johnn Hai, PA-C 03/30/17 1210    Earleen Newport, MD 03/30/17 [phone]

## 2017-05-21 ENCOUNTER — Ambulatory Visit (INDEPENDENT_AMBULATORY_CARE_PROVIDER_SITE_OTHER): Payer: BLUE CROSS/BLUE SHIELD | Admitting: Unknown Physician Specialty

## 2017-05-21 ENCOUNTER — Encounter: Payer: Self-pay | Admitting: Unknown Physician Specialty

## 2017-05-21 VITALS — BP 149/79 | HR 76 | Temp 98.4°F | Ht 64.2 in | Wt 263.3 lb

## 2017-05-21 DIAGNOSIS — E119 Type 2 diabetes mellitus without complications: Secondary | ICD-10-CM | POA: Diagnosis not present

## 2017-05-21 DIAGNOSIS — I10 Essential (primary) hypertension: Secondary | ICD-10-CM | POA: Diagnosis not present

## 2017-05-21 DIAGNOSIS — E668 Other obesity: Secondary | ICD-10-CM | POA: Diagnosis not present

## 2017-05-21 DIAGNOSIS — E782 Mixed hyperlipidemia: Secondary | ICD-10-CM

## 2017-05-21 DIAGNOSIS — Z0000 Encounter for general adult medical examination without abnormal findings: Secondary | ICD-10-CM | POA: Diagnosis not present

## 2017-05-21 DIAGNOSIS — Z794 Long term (current) use of insulin: Secondary | ICD-10-CM

## 2017-05-21 DIAGNOSIS — E669 Obesity, unspecified: Secondary | ICD-10-CM

## 2017-05-21 DIAGNOSIS — Z23 Encounter for immunization: Secondary | ICD-10-CM

## 2017-05-21 LAB — MICROALBUMIN, URINE WAIVED
Creatinine, Urine Waived: 300 mg/dL (ref 10–300)
Microalb, Ur Waived: 30 mg/L — ABNORMAL HIGH (ref 0–19)
Microalb/Creat Ratio: <30 mg/g

## 2017-05-21 LAB — BAYER DCA HB A1C WAIVED: HB A1C (BAYER DCA - WAIVED): 6.8 %

## 2017-05-21 MED ORDER — LISINOPRIL 10 MG PO TABS: 10.0000 mg | ORAL_TABLET | Freq: Every day | ORAL | 3 refills | Status: DC

## 2017-05-21 NOTE — Assessment & Plan Note (Signed)
Refusing to restart statin.  Start 1 ASA/day

## 2017-05-21 NOTE — Assessment & Plan Note (Addendum)
Hgb A1C 6.8 off medications.  Continue with lifestyle changes

## 2017-05-21 NOTE — Assessment & Plan Note (Addendum)
Diet and exercise which he is working on

## 2017-05-21 NOTE — Assessment & Plan Note (Signed)
Restart Lisinopril 10 mg daily

## 2017-05-21 NOTE — Progress Notes (Signed)
BP (!) 149/79 (BP Location: Left Arm, Cuff Size: Normal)   Pulse 76   Temp 98.4 F (36.9 C) (Oral)   Ht 5' 4.2" (1.631 m)   Wt 263 lb 4.8 oz (119.4 kg)   SpO2 97%   BMI 44.91 kg/m    Subjective:    Patient ID: Jonathan Griffin, male    DOB: 09-Oct-1965, 52 y.o.   MRN: [id number]  HPI: Jonathan Griffin is a 52 y.o. male  Chief Complaint  Patient presents with  . Annual Exam   Pt is lost to f/u.  Last time I saw him his blood sugar was under poor control.  Not taking any medications.  He stopped his Atorvastatin. Lisinoprik. Glipizide and Metformin.  States he stopped sugar drinks and thinking that will be a help.  [facility] and he did not go  Diabetes: No known  Feet problems:none Blood Sugars averaging:Not checking eye exam within last year yes Last Hgb A1C:12.0  Hypertension  Average home BPs not checking   Using medication without problems or lightheadedness No chest pain with exertion or shortness of breath.  He was in the ER for chest pain related to work injury in February.  He is better No Edema  Elevated Cholesterol Using medications without problems No Muscle aches  Diet: Exercise:   Social History   Socioeconomic History  . Marital status: Married    Spouse name: Not on file  . Number of children: Not on file  . Years of education: Not on file  . Highest education level: Not on file  Occupational History  . Not on file  Social Needs  . Financial resource strain: Not on file  . Food insecurity:    Worry: Not on file    Inability: Not on file  . Transportation needs:    Medical: Not on file    Non-medical: Not on file  Tobacco Use  . Smoking status: Current Every Day Smoker    Types: Cigarettes  . Smokeless tobacco: Former Engineer, water and Sexual Activity  . Alcohol use: Yes    Alcohol/week: 8.4 oz    Types: 14 Cans of beer per week  . Drug use: No  . Sexual activity: Never  Lifestyle  . Physical activity:      Days per week: Not on file    Minutes per session: Not on file  . Stress: Not on file  Relationships  . Social connections:    Talks on phone: Not on file    Gets together: Not on file    Attends religious service: Not on file    Active member of club or organization: Not on file    Attends meetings of clubs or organizations: Not on file    Relationship status: Not on file  . Intimate partner violence:    Fear of current or ex partner: Not on file    Emotionally abused: Not on file    Physically abused: Not on file    Forced sexual activity: Not on file  Other Topics Concern  . Not on file  Social History Narrative  . Not on file   Family History  Problem Relation Age of Onset  . Diabetes Father   . Hypertension Father    Past Medical History:  Diagnosis Date  . Diabetes mellitus without complication (HCC)   . Hyperlipemia   . Hypertension    Past Surgical History:  Procedure Laterality Date  .  LIPOMA EXCISION     neck     Relevant past medical, surgical, family and social history reviewed and updated as indicated. Interim medical history since our last visit reviewed. Allergies and medications reviewed and updated.  Review of Systems  Constitutional: Negative.   HENT: Negative.   Eyes: Negative.   Respiratory: Negative.   Cardiovascular: Negative.   Gastrointestinal: Negative.   Endocrine: Negative.   Genitourinary: Negative.   Skin: Negative.   Allergic/Immunologic: Negative.   Neurological: Negative.   Hematological: Negative.   Psychiatric/Behavioral: Negative.     Per HPI unless specifically indicated above     Objective:    BP (!) 149/79 (BP Location: Left Arm, Cuff Size: Normal)   Pulse 76   Temp 98.4 F (36.9 C) (Oral)   Ht 5' 4.2" (1.631 m)   Wt 263 lb 4.8 oz (119.4 kg)   SpO2 97%   BMI 44.91 kg/m   Wt Readings from Last 3 Encounters:  05/21/17 263 lb 4.8 oz (119.4 kg)  03/30/17 260 lb (117.9 kg)  05/04/16 252 lb 6.4 oz (114.5  kg)    Physical Exam  Constitutional: He is oriented to person, place, and time. He appears well-developed and well-nourished.  HENT:  Head: Normocephalic.  Right Ear: Tympanic membrane, external ear and ear canal normal.  Left Ear: Tympanic membrane, external ear and ear canal normal.  Mouth/Throat: Uvula is midline, oropharynx is clear and moist and mucous membranes are normal.  Eyes: Pupils are equal, round, and reactive to light.  Cardiovascular: Normal rate, regular rhythm and normal heart sounds. Exam reveals no gallop and no friction rub.  No murmur heard. Pulmonary/Chest: Effort normal and breath sounds normal. No respiratory distress.  Abdominal: Soft. Bowel sounds are normal. He exhibits no distension. There is no tenderness.  Musculoskeletal: Normal range of motion.  Neurological: He is alert and oriented to person, place, and time. He has normal reflexes.  Skin: Skin is warm and dry.  Psychiatric: He has a normal mood and affect. His behavior is normal. Judgment and thought content normal.    Results for orders placed or performed in visit on 05/21/17  HM DIABETES EYE EXAM  Result Value Ref Range   HM Diabetic Eye Exam No Retinopathy No Retinopathy      Assessment & Plan:   Problem List Items Addressed This Visit      Unprioritized   Extreme obesity    Diet and exercise which he is working on      Hyperlipidemia    Refusing to restart statin.  Start 1 ASA/day      Relevant Medications   lisinopril (PRINIVIL,ZESTRIL) 10 MG tablet   Other Relevant Orders   Lipid Panel w/o Chol/HDL Ratio   Hypertension    Restart Lisinopril 10 mg daily      Relevant Medications   lisinopril (PRINIVIL,ZESTRIL) 10 MG tablet   Other Relevant Orders   Comprehensive metabolic panel   Type 2 diabetes mellitus without complications (HCC)    Hgb A1C 6.8 off medications.  Continue with lifestyle changes      Relevant Medications   lisinopril (PRINIVIL,ZESTRIL) 10 MG tablet    Other Relevant Orders   Bayer DCA Hb A1c Waived   Microalbumin, Urine Waived    Other Visit Diagnoses    Routine general medical examination at a health care facility    -  Primary   Relevant Orders   TSH   PSA   Annual physical exam  HM: Due for colonoscopy Updating Td and pneumovax  Follow up plan: Return in about 1 month (around 06/18/2017) for plus in 1 year.

## 2017-05-21 NOTE — Patient Instructions (Signed)
Tdap Vaccine (Tetanus, Diphtheria and Pertussis): What You Need to Know 1. Why get vaccinated? Tetanus, diphtheria and pertussis are very serious diseases. Tdap vaccine can protect us from these diseases. And, Tdap vaccine given to pregnant women can protect newborn babies against pertussis. TETANUS (Lockjaw) is rare in the [place] today. It causes painful muscle tightening and stiffness, usually all over the body.  It can lead to tightening of muscles in the head and neck so you can't open your mouth, swallow, or sometimes even breathe. Tetanus kills about 1 out of 10 people who are infected even after receiving the best medical care.  DIPHTHERIA is also rare in the [place] today. It can cause a thick coating to form in the back of the throat.  It can lead to breathing problems, heart failure, paralysis, and death.  PERTUSSIS (Whooping Cough) causes severe coughing spells, which can cause difficulty breathing, vomiting and disturbed sleep.  It can also lead to weight loss, incontinence, and rib fractures. Up to 2 in 100 adolescents and 5 in 100 adults with pertussis are hospitalized or have complications, which could include pneumonia or death.  These diseases are caused by bacteria. Diphtheria and pertussis are spread from person to person through secretions from coughing or sneezing. Tetanus enters the body through cuts, scratches, or wounds. Before vaccines, as many as 200,000 cases of diphtheria, 200,000 cases of pertussis, and hundreds of cases of tetanus, were reported in the [place] each year. Since vaccination began, reports of cases for tetanus and diphtheria have dropped by about 99% and for pertussis by about 80%. 2. Tdap vaccine Tdap vaccine can protect adolescents and adults from tetanus, diphtheria, and pertussis. One dose of Tdap is routinely given at age 11 or 12. People who did not get Tdap at that age should get it as soon as possible. Tdap is especially  important for healthcare professionals and anyone having close contact with a baby younger than 12 months. Pregnant women should get a dose of Tdap during every pregnancy, to protect the newborn from pertussis. Infants are most at risk for severe, life-threatening complications from pertussis. Another vaccine, called Td, protects against tetanus and diphtheria, but not pertussis. A Td booster should be given every 10 years. Tdap may be given as one of these boosters if you have never gotten Tdap before. Tdap may also be given after a severe cut or burn to prevent tetanus infection. Your doctor or the person giving you the vaccine can give you more information. Tdap may safely be given at the same time as other vaccines. 3. Some people should not get this vaccine  A person who has ever had a life-threatening allergic reaction after a previous dose of any diphtheria, tetanus or pertussis containing vaccine, OR has a severe allergy to any part of this vaccine, should not get Tdap vaccine. Tell the person giving the vaccine about any severe allergies.  Anyone who had coma or long repeated seizures within 7 days after a childhood dose of DTP or DTaP, or a previous dose of Tdap, should not get Tdap, unless a cause other than the vaccine was found. They can still get Td.  Talk to your doctor if you: ? have seizures or another nervous system problem, ? had severe pain or swelling after any vaccine containing diphtheria, tetanus or pertussis, ? ever had a condition called Guillain-Barr Syndrome (GBS), ? aren't feeling well on the day the shot is scheduled. 4. Risks With any medicine, including   vaccines, there is a chance of side effects. These are usually mild and go away on their own. Serious reactions are also possible but are rare. Most people who get Tdap vaccine do not have any problems with it. Mild problems following Tdap: (Did not interfere with activities)  Pain where the shot was given (about  3 in 4 adolescents or 2 in 3 adults)  Redness or swelling where the shot was given (about 1 person in 5)  Mild fever of at least 100.4F (up to about 1 in 25 adolescents or 1 in 100 adults)  Headache (about 3 or 4 people in 10)  Tiredness (about 1 person in 3 or 4)  Nausea, vomiting, diarrhea, stomach ache (up to 1 in 4 adolescents or 1 in 10 adults)  Chills, sore joints (about 1 person in 10)  Body aches (about 1 person in 3 or 4)  Rash, swollen glands (uncommon)  Moderate problems following Tdap: (Interfered with activities, but did not require medical attention)  Pain where the shot was given (up to 1 in 5 or 6)  Redness or swelling where the shot was given (up to about 1 in 16 adolescents or 1 in 12 adults)  Fever over 102F (about 1 in 100 adolescents or 1 in 250 adults)  Headache (about 1 in 7 adolescents or 1 in 10 adults)  Nausea, vomiting, diarrhea, stomach ache (up to 1 or 3 people in 100)  Swelling of the entire arm where the shot was given (up to about 1 in 500).  Severe problems following Tdap: (Unable to perform usual activities; required medical attention)  Swelling, severe pain, bleeding and redness in the arm where the shot was given (rare).  Problems that could happen after any vaccine:  People sometimes faint after a medical procedure, including vaccination. Sitting or lying down for about 15 minutes can help prevent fainting, and injuries caused by a fall. Tell your doctor if you feel dizzy, or have vision changes or ringing in the ears.  Some people get severe pain in the shoulder and have difficulty moving the arm where a shot was given. This happens very rarely.  Any medication can cause a severe allergic reaction. Such reactions from a vaccine are very rare, estimated at fewer than 1 in a million doses, and would happen within a few minutes to a few hours after the vaccination. As with any medicine, there is a very remote chance of a vaccine  causing a serious injury or death. The safety of vaccines is always being monitored. For more information, visit: www.cdc.gov/vaccinesafety/ 5. What if there is a serious problem? What should I look for? Look for anything that concerns you, such as signs of a severe allergic reaction, very high fever, or unusual behavior. Signs of a severe allergic reaction can include hives, swelling of the face and throat, difficulty breathing, a fast heartbeat, dizziness, and weakness. These would usually start a few minutes to a few hours after the vaccination. What should I do?  If you think it is a severe allergic reaction or other emergency that can't wait, call 9-1-1 or get the person to the nearest hospital. Otherwise, call your doctor.  Afterward, the reaction should be reported to the Vaccine Adverse Event Reporting System (VAERS). Your doctor might file this report, or you can do it yourself through the VAERS web site at www.vaers.hhs.gov, or by calling 1-800-822-7967. ? VAERS does not give medical advice. 6. The National Vaccine Injury Compensation Program The National   Vaccine Injury Compensation Program (VICP) is a federal program that was created to compensate people who may have been injured by certain vaccines. Persons who believe they may have been injured by a vaccine can learn about the program and about filing a claim by calling 1-800-338-2382 or visiting the VICP website at www.hrsa.gov/vaccinecompensation. There is a time limit to file a claim for compensation. 7. How can I learn more?  Ask your doctor. He or she can give you the vaccine package insert or suggest other sources of information.  Call your local or state health department.  Contact the Centers for Disease Control and Prevention (CDC): ? Call 1-800-232-4636 (1-800-CDC-INFO) or ? Visit CDC's website at www.cdc.gov/vaccines CDC Tdap Vaccine VIS (03/28/13) This information is not intended to replace advice given to you by your  health care provider. Make sure you discuss any questions you have with your health care provider. Document Released: 07/21/2011 Document Revised: 10/10/2015 Document Reviewed: 10/10/2015 Elsevier Interactive Patient Education  2017 [employer] Pneumococcal Polysaccharide Vaccine: What You Need to Know 1. Why get vaccinated? Vaccination can protect older adults (and some children and younger adults) from pneumococcal disease. Pneumococcal disease is caused by bacteria that can spread from person to person through close contact. It can cause ear infections, and it can also lead to more serious infections of the:  Lungs (pneumonia),  Blood (bacteremia), and  Covering of the brain and spinal cord (meningitis). Meningitis can cause deafness and brain damage, and it can be fatal.  Anyone can get pneumococcal disease, but children under 2 years of age, people with certain medical conditions, adults over 65 years of age, and cigarette smokers are at the highest risk. About 18,000 older adults die each year from pneumococcal disease in the [place]. Treatment of pneumococcal infections with penicillin and other drugs used to be more effective. But some strains of the disease have become resistant to these drugs. This makes prevention of the disease, through vaccination, even more important. 2. Pneumococcal polysaccharide vaccine (PPSV23) Pneumococcal polysaccharide vaccine (PPSV23) protects against 23 types of pneumococcal bacteria. It will not prevent all pneumococcal disease. PPSV23 is recommended for:  All adults 65 years of age and older,  Anyone 2 through 52 years of age with certain long-term health problems,  Anyone 2 through 52 years of age with a weakened immune system,  Adults 19 through 52 years of age who smoke cigarettes or have asthma.  Most people need only one dose of PPSV. A second dose is recommended for certain high-risk groups. People 65 and older should get a dose  even if they have gotten one or more doses of the vaccine before they turned 65. Your healthcare provider can give you more information about these recommendations. Most healthy adults develop protection within 2 to 3 weeks of getting the shot. 3. Some people should not get this vaccine  Anyone who has had a life-threatening allergic reaction to PPSV should not get another dose.  Anyone who has a severe allergy to any component of PPSV should not receive it. Tell your provider if you have any severe allergies.  Anyone who is moderately or severely ill when the shot is scheduled may be asked to wait until they recover before getting the vaccine. Someone with a mild illness can usually be vaccinated.  Children less than 2 years of age should not receive this vaccine.  There is no evidence that PPSV is harmful to either a pregnant woman or to her fetus. However,   as a precaution, women who need the vaccine should be vaccinated before becoming pregnant, if possible. 4. Risks of a vaccine reaction With any medicine, including vaccines, there is a chance of side effects. These are usually mild and go away on their own, but serious reactions are also possible. About half of people who get PPSV have mild side effects, such as redness or pain where the shot is given, which go away within about two days. Less than 1 out of 100 people develop a fever, muscle aches, or more severe local reactions. Problems that could happen after any vaccine:  People sometimes faint after a medical procedure, including vaccination. Sitting or lying down for about 15 minutes can help prevent fainting, and injuries caused by a fall. Tell your doctor if you feel dizzy, or have vision changes or ringing in the ears.  Some people get severe pain in the shoulder and have difficulty moving the arm where a shot was given. This happens very rarely.  Any medication can cause a severe allergic reaction. Such reactions from a vaccine  are very rare, estimated at about 1 in a million doses, and would happen within a few minutes to a few hours after the vaccination. As with any medicine, there is a very remote chance of a vaccine causing a serious injury or death. The safety of vaccines is always being monitored. For more information, visit: www.cdc.gov/vaccinesafety/ 5. What if there is a serious reaction? What should I look for? Look for anything that concerns you, such as signs of a severe allergic reaction, very high fever, or unusual behavior. Signs of a severe allergic reaction can include hives, swelling of the face and throat, difficulty breathing, a fast heartbeat, dizziness, and weakness. These would usually start a few minutes to a few hours after the vaccination. What should I do? If you think it is a severe allergic reaction or other emergency that can't wait, call 9-1-1 or get to the nearest hospital. Otherwise, call your doctor. Afterward, the reaction should be reported to the Vaccine Adverse Event Reporting System (VAERS). Your doctor might file this report, or you can do it yourself through the VAERS web site at www.vaers.hhs.gov, or by calling 1-800-822-7967. VAERS does not give medical advice. 6. How can I learn more?  Ask your doctor. He or she can give you the vaccine package insert or suggest other sources of information.  Call your local or state health department.  Contact the Centers for Disease Control and Prevention (CDC): ? Call 1-800-232-4636 (1-800-CDC-INFO) or ? Visit CDC's website at www.cdc.gov/vaccines CDC Pneumococcal Polysaccharide Vaccine VIS (05/26/13) This information is not intended to replace advice given to you by your health care provider. Make sure you discuss any questions you have with your health care provider. Document Released: 11/16/2005 Document Revised: 10/10/2015 Document Reviewed: 10/10/2015 Elsevier Interactive Patient Education  2017 [employer]  

## 2017-05-21 NOTE — Addendum Note (Signed)
Addended by: Pablo LedgerUSSELL, BRITTANY N on: 05/21/2017 04:24 PM   Modules accepted: Orders

## 2017-05-22 LAB — COMPREHENSIVE METABOLIC PANEL WITH GFR
ALT: 28 IU/L (ref 0–44)
AST: 38 IU/L (ref 0–40)
Albumin/Globulin Ratio: 1.3 (ref 1.2–2.2)
Albumin: 4.1 g/dL (ref 3.5–5.5)
Alkaline Phosphatase: 122 IU/L — ABNORMAL HIGH (ref 39–117)
BUN/Creatinine Ratio: 10 (ref 9–20)
BUN: 7 mg/dL (ref 6–24)
Bilirubin Total: 0.5 mg/dL (ref 0.0–1.2)
CO2: 22 mmol/L (ref 20–29)
Calcium: 8.9 mg/dL (ref 8.7–10.2)
Chloride: 103 mmol/L (ref 96–106)
Creatinine, Ser: 0.68 mg/dL — ABNORMAL LOW (ref 0.76–1.27)
GFR calc Af Amer: 127 mL/min/1.73
GFR calc non Af Amer: 110 mL/min/1.73
Globulin, Total: 3.2 g/dL (ref 1.5–4.5)
Glucose: 131 mg/dL — ABNORMAL HIGH (ref 65–99)
Potassium: 4.3 mmol/L (ref 3.5–5.2)
Sodium: 139 mmol/L (ref 134–144)
Total Protein: 7.3 g/dL (ref 6.0–8.5)

## 2017-05-22 LAB — LIPID PANEL W/O CHOL/HDL RATIO
Cholesterol, Total: 171 mg/dL (ref 100–199)
HDL: 65 mg/dL
LDL Calculated: 91 mg/dL (ref 0–99)
Triglycerides: 76 mg/dL (ref 0–149)
VLDL Cholesterol Cal: 15 mg/dL (ref 5–40)

## 2017-05-22 LAB — TSH: TSH: 2.3 u[IU]/mL (ref 0.450–4.500)

## 2017-05-22 LAB — PSA: Prostate Specific Ag, Serum: 0.6 ng/mL (ref 0.0–4.0)

## 2017-05-24 ENCOUNTER — Encounter: Payer: Self-pay | Admitting: Unknown Physician Specialty

## 2017-06-18 ENCOUNTER — Ambulatory Visit: Payer: BLUE CROSS/BLUE SHIELD | Admitting: Unknown Physician Specialty

## 2018-05-24 ENCOUNTER — Encounter: Payer: BLUE CROSS/BLUE SHIELD | Admitting: Unknown Physician Specialty

## 2018-05-24 ENCOUNTER — Encounter: Payer: Self-pay | Admitting: Nurse Practitioner

## 2019-04-07 ENCOUNTER — Encounter: Payer: BC Managed Care – PPO | Admitting: Nurse Practitioner

## 2019-04-07 NOTE — Progress Notes (Deleted)
There were no vitals taken for this visit.   Subjective:    Patient ID: Jonathan Griffin, male    DOB: 1965/12/20, 54 y.o.   MRN: [id number]  HPI: Jonathan Griffin is a 54 y.o. male presenting on 04/07/2019 for comprehensive medical examination. Current medical complaints include:{Blank single:19197::"none","***"}  He currently lives with: Interim Problems from his last visit: {Blank single:19197::"yes","no"}  Depression Screen done today and results listed below:  Depression screen [facility] 2/9 05/21/2017 04/09/2015  Decreased Interest 0 0  Down, Depressed, Hopeless 0 0  PHQ - 2 Score 0 0    The patient {has/does not have:19849} a history of falls. I {did/did not:19850} complete a risk assessment for falls. A plan of care for falls {was/was not:19852} documented.   Past Medical History:  Past Medical History:  Diagnosis Date  . Diabetes mellitus without complication ([place])   . Hyperlipemia   . Hypertension     Surgical History:  Past Surgical History:  Procedure Laterality Date  . LIPOMA EXCISION     neck    Medications:  Current Outpatient Medications on File Prior to Visit  Medication Sig  . aspirin EC 81 MG tablet Take 1 tablet (81 mg total) by mouth daily.  . blood glucose meter kit and supplies Dispense based on patient and insurance preference. Use up to four times daily as directed. (FOR ICD-9 250.00, 250.01).  Marland Kitchen lisinopril (PRINIVIL,ZESTRIL) 10 MG tablet Take 1 tablet (10 mg total) by mouth daily.  . naproxen (NAPROSYN) 500 MG tablet Take 1 tablet (500 mg total) by mouth 2 (two) times daily with a meal.   No current facility-administered medications on file prior to visit.    Allergies:  No Known Allergies  Social History:  Social History   Socioeconomic History  . Marital status: Married    Spouse name: Not on file  . Number of children: Not on file  . Years of education: Not on file  . Highest education level: Not on file  Occupational History  . Not on  file  Tobacco Use  . Smoking status: Current Every Day Smoker    Types: Cigarettes  . Smokeless tobacco: Former Network engineer and Sexual Activity  . Alcohol use: Yes    Alcohol/week: 14.0 standard drinks    Types: 14 Cans of beer per week  . Drug use: No  . Sexual activity: Never  Other Topics Concern  . Not on file  Social History Narrative  . Not on file   Social Determinants of Health   Financial Resource Strain:   . Difficulty of Paying Living Expenses: Not on file  Food Insecurity:   . Worried About Charity fundraiser in the Last Year: Not on file  . Ran Out of Food in the Last Year: Not on file  Transportation Needs:   . Lack of Transportation (Medical): Not on file  . Lack of Transportation (Non-Medical): Not on file  Physical Activity:   . Days of Exercise per Week: Not on file  . Minutes of Exercise per Session: Not on file  Stress:   . Feeling of Stress : Not on file  Social Connections:   . Frequency of Communication with Friends and Family: Not on file  . Frequency of Social Gatherings with Friends and Family: Not on file  . Attends Religious Services: Not on file  . Active Member of Clubs or Organizations: Not on file  . Attends Archivist Meetings: Not on file  .  Marital Status: Not on file  Intimate Partner Violence:   . Fear of Current or Ex-Partner: Not on file  . Emotionally Abused: Not on file  . Physically Abused: Not on file  . Sexually Abused: Not on file   Social History   Tobacco Use  Smoking Status Current Every Day Smoker  . Types: Cigarettes  Smokeless Tobacco Former Systems developer   Social History   Substance and Sexual Activity  Alcohol Use Yes  . Alcohol/week: 14.0 standard drinks  . Types: 14 Cans of beer per week    Family History:  Family History  Problem Relation Age of Onset  . Diabetes Father   . Hypertension Father     Past medical history, surgical history, medications, allergies, family history and social  history reviewed with patient today and changes made to appropriate areas of the chart.   Review of Systems - {ros master:310782} All other ROS negative except what is listed above and in the HPI.      Objective:    There were no vitals taken for this visit.  Wt Readings from Last 3 Encounters:  05/21/17 263 lb 4.8 oz (119.4 kg)  03/30/17 260 lb (117.9 kg)  05/04/16 252 lb 6.4 oz (114.5 kg)    Physical Exam  Results for orders placed or performed in visit on 05/21/17  Comprehensive metabolic panel  Result Value Ref Range   Glucose 131 (H) 65 - 99 mg/dL   BUN 7 6 - 24 mg/dL   Creatinine, Ser 0.68 (L) 0.76 - 1.27 mg/dL   GFR calc non Af Amer 110 >59 mL/min/1.73   GFR calc Af Amer 127 >59 mL/min/1.73   BUN/Creatinine Ratio 10 9 - 20   Sodium 139 134 - 144 mmol/L   Potassium 4.3 3.5 - 5.2 mmol/L   Chloride 103 96 - 106 mmol/L   CO2 22 20 - 29 mmol/L   Calcium 8.9 8.7 - 10.2 mg/dL   Total Protein 7.3 6.0 - 8.5 g/dL   Albumin 4.1 3.5 - 5.5 g/dL   Globulin, Total 3.2 1.5 - 4.5 g/dL   Albumin/Globulin Ratio 1.3 1.2 - 2.2   Bilirubin Total 0.5 0.0 - 1.2 mg/dL   Alkaline Phosphatase 122 (H) 39 - 117 IU/L   AST 38 0 - 40 IU/L   ALT 28 0 - 44 IU/L  Lipid Panel w/o Chol/HDL Ratio  Result Value Ref Range   Cholesterol, Total 171 100 - 199 mg/dL   Triglycerides 76 0 - 149 mg/dL   HDL 65 >39 mg/dL   VLDL Cholesterol Cal 15 5 - 40 mg/dL   LDL Calculated 91 0 - 99 mg/dL  Bayer DCA Hb A1c Waived  Result Value Ref Range   HB A1C (BAYER DCA - WAIVED) 6.8 <7.0 %  Microalbumin, Urine Waived  Result Value Ref Range   Microalb, Ur Waived 30 (H) 0 - 19 mg/L   Creatinine, Urine Waived 300 10 - 300 mg/dL   Microalb/Creat Ratio <30 <30 mg/g  TSH  Result Value Ref Range   TSH 2.300 0.450 - 4.500 uIU/mL  PSA  Result Value Ref Range   Prostate Specific Ag, Serum 0.6 0.0 - 4.0 ng/mL  HM DIABETES EYE EXAM  Result Value Ref Range   HM Diabetic Eye Exam No Retinopathy No Retinopathy        Assessment & Plan:   Problem List Items Addressed This Visit    None       Discussed aspirin prophylaxis for myocardial  infarction prevention and decision was {Blank single:19197::"it was not indicated","made to continue ASA","made to start ASA","made to stop ASA","that we recommended ASA, and patient refused"}  LABORATORY TESTING:  Health maintenance labs ordered today as discussed above.   The natural history of prostate cancer and ongoing controversy regarding screening and potential treatment outcomes of prostate cancer has been discussed with the patient. The meaning of a false positive PSA and a false negative PSA has been discussed. He indicates understanding of the limitations of this screening test and wishes *** to proceed with screening PSA testing.   IMMUNIZATIONS:   - Tdap: Tetanus vaccination status reviewed: {tetanus status:315746}. - Influenza: {Blank single:19197::"Up to date","Administered today","Postponed to flu season","Refused","Given elsewhere"} - Pneumovax: {Blank single:19197::"Up to date","Administered today","Not applicable","Refused","Given elsewhere"} - Prevnar: {Blank single:19197::"Up to date","Administered today","Not applicable","Refused","Given elsewhere"} - HPV: {Blank single:19197::"Up to date","Administered today","Not applicable","Refused","Given elsewhere"} - Zostavax vaccine: {Blank single:19197::"Up to date","Administered today","Not applicable","Refused","Given elsewhere"}  SCREENING: - Colonoscopy: {Blank single:19197::"Up to date","Ordered today","Not applicable","Refused","Done elsewhere"}  Discussed with patient purpose of the colonoscopy is to detect colon cancer at curable precancerous or early stages   - AAA Screening: {Blank single:19197::"Up to date","Ordered today","Not applicable","Refused","Done elsewhere"}  -Hearing Test: {Blank single:19197::"Up to date","Ordered today","Not applicable","Refused","Done elsewhere"}  -Spirometry:  {Blank single:19197::"Up to date","Ordered today","Not applicable","Refused","Done elsewhere"}   PATIENT COUNSELING:    Sexuality: Discussed sexually transmitted diseases, partner selection, use of condoms, avoidance of unintended pregnancy  and contraceptive alternatives.   Advised to avoid cigarette smoking.  I discussed with the patient that most people either abstain from alcohol or drink within safe limits (<=14/week and <=4 drinks/occasion for males, <=7/weeks and <= 3 drinks/occasion for females) and that the risk for alcohol disorders and other health effects rises proportionally with the number of drinks per week and how often a drinker exceeds daily limits.  Discussed cessation/primary prevention of drug use and availability of treatment for abuse.   Diet: Encouraged to adjust caloric intake to maintain  or achieve ideal body weight, to reduce intake of dietary saturated fat and total fat, to limit sodium intake by avoiding high sodium foods and not adding table salt, and to maintain adequate dietary potassium and calcium preferably from fresh fruits, vegetables, and low-fat dairy products.    stressed the importance of regular exercise  Injury prevention: Discussed safety belts, safety helmets, smoke detector, smoking near bedding or upholstery.   Dental health: Discussed importance of regular tooth brushing, flossing, and dental visits.   Follow up plan: NEXT PREVENTATIVE PHYSICAL DUE IN 1 YEAR. No follow-ups on file.

## 2019-04-28 ENCOUNTER — Ambulatory Visit: Payer: Self-pay | Attending: Internal Medicine

## 2019-04-28 DIAGNOSIS — Z23 Encounter for immunization: Secondary | ICD-10-CM

## 2019-04-28 NOTE — Progress Notes (Signed)
   Covid-19 Vaccination Clinic  Name:  BRAM HOTTEL    MRN: [id number] DOB: [birth year]  04/28/2019  Mr. Esson was observed post Covid-19 immunization for 15 minutes without incident. He was provided with Vaccine Information Sheet and instruction to access the V-Safe system.   Mr. Keng was instructed to call 911 with any severe reactions post vaccine: Marland Kitchen Difficulty breathing  . Swelling of face and throat  . A fast heartbeat  . A bad rash all over body  . Dizziness and weakness   Immunizations Administered    Name Date Dose VIS Date Route   Pfizer COVID-19 Vaccine 04/28/2019  3:36 PM 0.3 mL 01/13/2019 Intramuscular   Manufacturer: [employer], [employer]   Lot: [id number]   NDC: [address]-6

## 2019-05-19 ENCOUNTER — Ambulatory Visit: Payer: Self-pay | Attending: Internal Medicine

## 2019-05-19 DIAGNOSIS — Z23 Encounter for immunization: Secondary | ICD-10-CM

## 2019-05-19 NOTE — Progress Notes (Signed)
   Covid-19 Vaccination Clinic  Name:  Jonathan Griffin    MRN: [id number] DOB: [birth year]  05/19/2019  [name] was observed post Covid-19 immunization for 15 minutes without incident. He was provided with Vaccine Information Sheet and instruction to access the V-Safe system.   [name] was instructed to call 911 with any severe reactions post vaccine: Marland Kitchen Difficulty breathing  . Swelling of face and throat  . A fast heartbeat  . A bad rash all over body  . Dizziness and weakness   Immunizations Administered    Name Date Dose VIS Date Route   Pfizer COVID-19 Vaccine 05/19/2019  3:06 PM 0.3 mL 01/13/2019 Intramuscular   Manufacturer: [employer], [employer]   Lot: [id number]   NDC: [address]-9

## 2020-10-26 ENCOUNTER — Emergency Department: Payer: Self-pay

## 2020-10-26 ENCOUNTER — Other Ambulatory Visit: Payer: Self-pay

## 2020-10-26 ENCOUNTER — Emergency Department
Admission: EM | Admit: 2020-10-26 | Discharge: 2020-10-26 | Disposition: A | Discharge status: Home / Self Care | Payer: Self-pay | Attending: Emergency Medicine | Admitting: Emergency Medicine

## 2020-10-26 DIAGNOSIS — I10 Essential (primary) hypertension: Secondary | ICD-10-CM | POA: Insufficient documentation

## 2020-10-26 DIAGNOSIS — R0602 Shortness of breath: Secondary | ICD-10-CM | POA: Insufficient documentation

## 2020-10-26 DIAGNOSIS — R140 Abdominal distension (gaseous): Secondary | ICD-10-CM | POA: Insufficient documentation

## 2020-10-26 DIAGNOSIS — E119 Type 2 diabetes mellitus without complications: Secondary | ICD-10-CM | POA: Insufficient documentation

## 2020-10-26 DIAGNOSIS — K7460 Unspecified cirrhosis of liver: Secondary | ICD-10-CM | POA: Insufficient documentation

## 2020-10-26 DIAGNOSIS — Z79899 Other long term (current) drug therapy: Secondary | ICD-10-CM | POA: Insufficient documentation

## 2020-10-26 DIAGNOSIS — F17210 Nicotine dependence, cigarettes, uncomplicated: Secondary | ICD-10-CM | POA: Insufficient documentation

## 2020-10-26 DIAGNOSIS — R188 Other ascites: Secondary | ICD-10-CM

## 2020-10-26 DIAGNOSIS — Z7982 Long term (current) use of aspirin: Secondary | ICD-10-CM | POA: Insufficient documentation

## 2020-10-26 LAB — URINALYSIS, COMPLETE (UACMP) WITH MICROSCOPIC
Bacteria, UA: NONE SEEN
Bilirubin Urine: NEGATIVE
Glucose, UA: NEGATIVE mg/dL
Hgb urine dipstick: NEGATIVE
Ketones, ur: 5 mg/dL — AB
Leukocytes,Ua: NEGATIVE
Nitrite: NEGATIVE
Protein, ur: 30 mg/dL — AB
Specific Gravity, Urine: 1.026 (ref 1.005–1.030)
pH: 5 (ref 5.0–8.0)

## 2020-10-26 LAB — CBC WITH DIFFERENTIAL/PLATELET
Abs Immature Granulocytes: 0.02 10*3/uL (ref 0.00–0.07)
Basophils Absolute: 0.1 10*3/uL (ref 0.0–0.1)
Basophils Relative: 1 %
Eosinophils Absolute: 0.4 10*3/uL (ref 0.0–0.5)
Eosinophils Relative: 6 %
HCT: 32.6 % — ABNORMAL LOW (ref 39.0–52.0)
Hemoglobin: 10.5 g/dL — ABNORMAL LOW (ref 13.0–17.0)
Immature Granulocytes: 0 %
Lymphocytes Relative: 28 %
Lymphs Abs: 2 10*3/uL (ref 0.7–4.0)
MCH: 25.7 pg — ABNORMAL LOW (ref 26.0–34.0)
MCHC: 32.2 g/dL (ref 30.0–36.0)
MCV: 79.7 fL — ABNORMAL LOW (ref 80.0–100.0)
Monocytes Absolute: 0.9 10*3/uL (ref 0.1–1.0)
Monocytes Relative: 12 %
Neutro Abs: 4 10*3/uL (ref 1.7–7.7)
Neutrophils Relative %: 53 %
Platelets: 417 10*3/uL — ABNORMAL HIGH (ref 150–400)
RBC: 4.09 MIL/uL — ABNORMAL LOW (ref 4.22–5.81)
RDW: 16.9 % — ABNORMAL HIGH (ref 11.5–15.5)
WBC: 7.4 10*3/uL (ref 4.0–10.5)
nRBC: 0 % (ref 0.0–0.2)

## 2020-10-26 LAB — COMPREHENSIVE METABOLIC PANEL
ALT: 18 U/L (ref 0–44)
AST: 33 U/L (ref 15–41)
Albumin: 3.6 g/dL (ref 3.5–5.0)
Alkaline Phosphatase: 103 U/L (ref 38–126)
Anion gap: 12 (ref 5–15)
BUN: 7 mg/dL (ref 6–20)
CO2: 21 mmol/L — ABNORMAL LOW (ref 22–32)
Calcium: 8.7 mg/dL — ABNORMAL LOW (ref 8.9–10.3)
Chloride: 103 mmol/L (ref 98–111)
Creatinine, Ser: 0.91 mg/dL (ref 0.61–1.24)
GFR, Estimated: >60 mL/min (ref >60)
Glucose, Bld: 192 mg/dL — ABNORMAL HIGH (ref 70–99)
Potassium: 4.3 mmol/L (ref 3.5–5.1)
Sodium: 136 mmol/L (ref 135–145)
Total Bilirubin: 0.6 mg/dL (ref 0.3–1.2)
Total Protein: 6.9 g/dL (ref 6.5–8.1)

## 2020-10-26 LAB — BRAIN NATRIURETIC PEPTIDE: B Natriuretic Peptide: 45.3 pg/mL (ref 0.0–100.0)

## 2020-10-26 LAB — TROPONIN I (HIGH SENSITIVITY): Troponin I (High Sensitivity): 5 ng/L (ref <18)

## 2020-10-26 MED ORDER — IOHEXOL 350 MG/ML SOLN
100.0000 mL | Freq: Once | INTRAVENOUS | Status: AC | PRN
  Administered 2020-10-26: 100 mL via INTRAVENOUS

## 2020-10-26 NOTE — ED Triage Notes (Signed)
Pt states that he has had a swollen abd for 2 weeks- pt has a hx of DM and HTN, but denies any CHF or liver problems- pt does not have any pain in the abd

## 2020-10-26 NOTE — Discharge Instructions (Addendum)
Please follow-up with gastroenterology for further evaluation of possible liver disease.  Avoid any alcohol or Tylenol in the meantime.

## 2020-10-26 NOTE — ED Provider Notes (Signed)
[facility] Emergency Department Provider Note  ____________________________________________  Time seen: Approximately 5:45 PM  I have reviewed the triage vital signs and the nursing notes.   HISTORY  Chief Complaint Bloated    HPI Jonathan Griffin is a 55 y.o. male with a past history of hypertension and diabetes who comes ED complaining of worsening abdominal distention for the past 2 weeks.  Gradual onset, constant, waxing and waning, no aggravating or alleviating factors.  Denies any significant pain.  No vomiting or diarrhea, no constipation.  No black or bloody stool.  Denies dizziness or syncope or exertional symptoms.  No history of CHF or cirrhosis.  He has never had anything like this before.    Past Medical History:  Diagnosis Date   Diabetes mellitus without complication ([place])    Hyperlipemia    Hypertension      Patient Active Problem List   Diagnosis Date Noted   Sleep apnea 04/09/2015   Extreme obesity 04/09/2015   Hyperlipidemia 04/04/2015   Type 2 diabetes mellitus without complications ([place]) 53/66/4403   Hypertension 04/04/2015     Past Surgical History:  Procedure Laterality Date   LIPOMA EXCISION     neck     Prior to Admission medications   Medication Sig Start Date End Date Taking? Authorizing Provider  aspirin EC 81 MG tablet Take 1 tablet (81 mg total) by mouth daily. 05/04/16   Kathrine Haddock, NP  blood glucose meter kit and supplies Dispense based on patient and insurance preference. Use up to four times daily as directed. (FOR ICD-9 250.00, 250.01). 05/04/16   Kathrine Haddock, NP  lisinopril (PRINIVIL,ZESTRIL) 10 MG tablet Take 1 tablet (10 mg total) by mouth daily. 05/21/17   Kathrine Haddock, NP  naproxen (NAPROSYN) 500 MG tablet Take 1 tablet (500 mg total) by mouth 2 (two) times daily with a meal. 03/30/17   Johnn Hai, PA-C     Allergies Patient has no known allergies.   Family History  Problem Relation  Age of Onset   Diabetes Father    Hypertension Father     Social History Social History   Tobacco Use   Smoking status: Every Day    Types: Cigarettes   Smokeless tobacco: Former  Substance Use Topics   Alcohol use: Yes    Alcohol/week: 14.0 standard drinks    Types: 14 Cans of beer per week   Drug use: No    Review of Systems  Constitutional:   No fever or chills.  ENT:   No sore throat. No rhinorrhea. Cardiovascular:   No chest pain or syncope. Respiratory:   No dyspnea or cough. Gastrointestinal:   Negative for abdominal pain, vomiting and diarrhea.  Musculoskeletal:   Negative for focal pain or swelling All other systems reviewed and are negative except as documented above in ROS and HPI.  ____________________________________________   PHYSICAL EXAM:  VITAL SIGNS: ED Triage Vitals [10/26/20 1457]  Enc Vitals Group     BP 123/86     Pulse Rate 84     Resp 20     Temp 98.5 F (36.9 C)     Temp Source Oral     SpO2 100 %     Weight 260 lb (117.9 kg)     Height 6' (1.829 m)     Head Circumference      Peak Flow      Pain Score 0     Pain Loc  Pain Edu?      Excl. in [place]?     Vital signs reviewed, nursing assessments reviewed.   Constitutional:   Alert and oriented. Non-toxic appearance. Eyes:   Conjunctivae are normal. EOMI. PERRL. ENT      Head:   Normocephalic and atraumatic.      Nose:   Wearing a mask.      Mouth/Throat:   Wearing a mask.      Neck:   No meningismus. Full ROM. Hematological/Lymphatic/Immunilogical:   No cervical lymphadenopathy. Cardiovascular:   RRR. Symmetric bilateral radial and DP pulses.  No murmurs. Cap refill less than 2 seconds. Respiratory:   Normal respiratory effort without tachypnea/retractions. Breath sounds are clear and equal bilaterally. No wheezes/rales/rhonchi. Gastrointestinal:   Soft with mild left lower quadrant tenderness.  Moderately distended. There is no CVA tenderness.  No rebound, rigidity, or  guarding.  No palpable fluid wave.  Not tympanitic.  No caput medusae or abdominal bruising/discoloration Genitourinary:   deferred Musculoskeletal:   Normal range of motion in all extremities. No joint effusions.  No lower extremity tenderness.  No edema. Neurologic:   Normal speech and language.  Motor grossly intact. No acute focal neurologic deficits are appreciated.  Skin:    Skin is warm, dry and intact. No rash noted.  No petechiae, purpura, or bullae.  ____________________________________________    LABS (pertinent positives/negatives) (all labs ordered are listed, but only abnormal results are displayed) Labs Reviewed  COMPREHENSIVE METABOLIC PANEL - Abnormal; Notable for the following components:      Result Value   CO2 21 (*)    Glucose, Bld 192 (*)    Calcium 8.7 (*)    All other components within normal limits  CBC WITH DIFFERENTIAL/PLATELET - Abnormal; Notable for the following components:   RBC 4.09 (*)    Hemoglobin 10.5 (*)    HCT 32.6 (*)    MCV 79.7 (*)    MCH 25.7 (*)    RDW 16.9 (*)    Platelets 417 (*)    All other components within normal limits  URINALYSIS, COMPLETE (UACMP) WITH MICROSCOPIC - Abnormal; Notable for the following components:   Color, Urine AMBER (*)    APPearance HAZY (*)    Ketones, ur 5 (*)    Protein, ur 30 (*)    All other components within normal limits  BRAIN NATRIURETIC PEPTIDE  TROPONIN I (HIGH SENSITIVITY)  TROPONIN I (HIGH SENSITIVITY)   ____________________________________________   EKG    ____________________________________________    RADIOLOGY  DG Chest 2 View  Result Date: 10/26/2020 CLINICAL DATA:  Shortness of breath EXAM: CHEST - 2 VIEW COMPARISON:  03/30/2017 FINDINGS: The heart size and mediastinal contours are within normal limits. Both lungs are clear. The visualized skeletal structures are unremarkable. IMPRESSION: No acute abnormality of the lungs. Electronically Signed   By: Eddie Candle M.D.   On:  10/26/2020 15:19   CT ABDOMEN PELVIS W CONTRAST  Result Date: 10/26/2020 CLINICAL DATA:  Abdominal distension. EXAM: CT ABDOMEN AND PELVIS WITH CONTRAST TECHNIQUE: Multidetector CT imaging of the abdomen and pelvis was performed using the standard protocol following bolus administration of intravenous contrast. CONTRAST:  167m OMNIPAQUE IOHEXOL 350 MG/ML SOLN COMPARISON:  March 28, 2009. FINDINGS: Lower chest: No acute abnormality. Hepatobiliary: Hepatic cirrhosis is noted. No gallstones or biliary dilatation is noted. Pancreas: Unremarkable. No pancreatic ductal dilatation or surrounding inflammatory changes. Spleen: Normal in size without focal abnormality. Adrenals/Urinary Tract: Adrenal glands appear normal. Bilateral renal cysts are  noted. No hydronephrosis or renal obstruction is noted. Urinary bladder is unremarkable. Stomach/Bowel: Stomach is within normal limits. Appendix appears normal. No evidence of bowel wall thickening, distention, or inflammatory changes. Vascular/Lymphatic: No significant vascular findings are present. No enlarged abdominal or pelvic lymph nodes. Reproductive: Prostate is unremarkable. Other: Moderate ascites is noted in the pelvis and around the liver and spleen. Small fluid-filled periumbilical hernia is noted. Musculoskeletal: No acute or significant osseous findings. IMPRESSION: Hepatic cirrhosis. Moderate ascites is noted. Small fluid-filled periumbilical hernia. Electronically Signed   By: Marijo Conception M.D.   On: 10/26/2020 18:20    ____________________________________________   PROCEDURES Procedures  ____________________________________________  DIFFERENTIAL DIAGNOSIS   Diverticulitis, bowel obstruction, ascites, morbid obesity/metabolic syndrome, intra-abdominal mass  CLINICAL IMPRESSION / ASSESSMENT AND PLAN / ED COURSE  Medications ordered in the ED: Medications  iohexol (OMNIPAQUE) 350 MG/ML injection 100 mL (100 mLs Intravenous Contrast Given  10/26/20 1755)    Pertinent labs & imaging results that were available during my care of the patient were reviewed by me and considered in my medical decision making (see chart for details).  Jonathan Griffin was evaluated in Emergency Department on 10/26/2020 for the symptoms described in the history of present illness. He was evaluated in the context of the global COVID-19 pandemic, which necessitated consideration that the patient might be at risk for infection with the SARS-CoV-2 virus that causes COVID-19. Institutional protocols and algorithms that pertain to the evaluation of patients at risk for COVID-19 are in a state of rapid change based on information released by regulatory bodies including the CDC and federal and state organizations. These policies and algorithms were followed during the patient's care in the ED.   Patient presents with abdominal distention has some mild lower abdominal tenderness.  Vital signs are normal.  This is gradual onset over 2 weeks.  Will obtain CT scan of the abdomen and pelvis for this to further evaluate.  Labs appear unremarkable except for hemoglobin of 10 compared to a baseline of 14 from 4 years ago.  He denies any bloody or melanotic stool and I doubt significant GI bleed as a source for his symptoms today.Marland Kitchen   ----------------------------------------- 7:23 PM on 10/26/2020 ----------------------------------------- CT scan shows moderate ascites, evidence of cirrhosis.  No other acute findings.  Patient is ambulatory, pain-free, well-appearing.  Recommend outpatient follow-up with gastroenterology this week, counseled on avoiding alcohol and Tylenol.  He denies any history of heavy or habitual drinking or excessive Tylenol use.     ____________________________________________   FINAL CLINICAL IMPRESSION(S) / ED DIAGNOSES    Final diagnoses:  Other ascites     ED Discharge Orders     None       Portions of this note were generated with  dragon dictation software. Dictation errors may occur despite best attempts at proofreading.    Carrie Mew, MD 10/26/20 [phone]

## 2020-10-26 NOTE — ED Provider Notes (Signed)
Emergency Medicine Provider Triage Evaluation Note  IDRISSA BEVILLE, a 55 y.o. male  was evaluated in triage.  Pt complains of abdominal swelling for the last 2 weeks. He denies previous fluid retention, liver problems, or CHF. He take meds for HTN & DM. He notes SOB intermittently and decreased urine intake. He notes subjective fevers, but denies frank CP or LE edema.   Review of Systems  Positive: Abdominal distension Negative: CP, LE edema  Physical Exam  BP 123/86 (BP Location: Right Arm)   Pulse 84   Temp 98.5 F (36.9 C) (Oral)   Resp 20   Ht 6' (1.829 m)   Wt 117.9 kg   SpO2 100%   BMI 35.26 kg/m  Gen:   Awake, no distress  NAD Resp:  Normal effort CTA MSK:   Moves extremities without difficulty  Other:  ABD: distended, nontender  Medical Decision Making  Medically screening exam initiated at 2:58 PM.  Appropriate orders placed.  Mayank GERREN HOFFMEIER was informed that the remainder of the evaluation will be completed by another provider, this initial triage assessment does not replace that evaluation, and the importance of remaining in the ED until their evaluation is complete.  Patient with ED evaluation of abdominal distension.    Lissa Hoard, PA-C 10/26/20 1502    Concha Se, MD 10/26/20 [phone]

## 2020-11-02 DIAGNOSIS — K7031 Alcoholic cirrhosis of liver with ascites: Secondary | ICD-10-CM | POA: Insufficient documentation

## 2020-11-02 DIAGNOSIS — D649 Anemia, unspecified: Secondary | ICD-10-CM | POA: Insufficient documentation

## 2020-11-08 ENCOUNTER — Telehealth: Payer: Self-pay

## 2020-11-12 ENCOUNTER — Ambulatory Visit: Payer: BC Managed Care – PPO | Admitting: Internal Medicine

## 2020-11-12 ENCOUNTER — Ambulatory Visit: Payer: BC Managed Care – PPO | Admitting: Nurse Practitioner

## 2020-11-18 ENCOUNTER — Ambulatory Visit: Payer: Self-pay | Admitting: Internal Medicine

## 2020-12-09 ENCOUNTER — Other Ambulatory Visit: Payer: Self-pay

## 2020-12-09 ENCOUNTER — Ambulatory Visit (INDEPENDENT_AMBULATORY_CARE_PROVIDER_SITE_OTHER): Payer: Self-pay | Admitting: Gastroenterology

## 2020-12-09 ENCOUNTER — Other Ambulatory Visit
Admission: RE | Admit: 2020-12-09 | Discharge: 2020-12-09 | Disposition: A | Discharge status: Home / Self Care | Payer: Self-pay | Attending: Gastroenterology | Admitting: Gastroenterology

## 2020-12-09 VITALS — BP 125/86 | HR 98 | Temp 99.4°F | Ht 72.0 in | Wt 251.4 lb

## 2020-12-09 DIAGNOSIS — K7031 Alcoholic cirrhosis of liver with ascites: Secondary | ICD-10-CM | POA: Insufficient documentation

## 2020-12-09 DIAGNOSIS — D500 Iron deficiency anemia secondary to blood loss (chronic): Secondary | ICD-10-CM

## 2020-12-09 LAB — COMPREHENSIVE METABOLIC PANEL
ALT: 21 U/L (ref 0–44)
AST: 34 U/L (ref 15–41)
Albumin: 3.9 g/dL (ref 3.5–5.0)
Alkaline Phosphatase: 141 U/L — ABNORMAL HIGH (ref 38–126)
Anion gap: 12 (ref 5–15)
BUN: 17 mg/dL (ref 6–20)
CO2: 21 mmol/L — ABNORMAL LOW (ref 22–32)
Calcium: 9.4 mg/dL (ref 8.9–10.3)
Chloride: 104 mmol/L (ref 98–111)
Creatinine, Ser: 0.95 mg/dL (ref 0.61–1.24)
GFR, Estimated: >60 mL/min (ref >60)
Glucose, Bld: 122 mg/dL — ABNORMAL HIGH (ref 70–99)
Potassium: 4 mmol/L (ref 3.5–5.1)
Sodium: 137 mmol/L (ref 135–145)
Total Bilirubin: 0.4 mg/dL (ref 0.3–1.2)
Total Protein: 7.8 g/dL (ref 6.5–8.1)

## 2020-12-09 LAB — IRON AND TIBC
Iron: 23 ug/dL — ABNORMAL LOW (ref 45–182)
Saturation Ratios: 5 % — ABNORMAL LOW (ref 17.9–39.5)
TIBC: 482 ug/dL — ABNORMAL HIGH (ref 250–450)
UIBC: 459 ug/dL

## 2020-12-09 LAB — FERRITIN: Ferritin: 9 ng/mL — ABNORMAL LOW (ref 24–336)

## 2020-12-09 LAB — HEPATITIS C ANTIBODY: HCV Ab: NONREACTIVE

## 2020-12-09 LAB — HEPATITIS A ANTIBODY, TOTAL: hep A Total Ab: REACTIVE — AB

## 2020-12-09 LAB — HEPATITIS B SURFACE ANTIGEN: Hepatitis B Surface Ag: NONREACTIVE

## 2020-12-09 LAB — HEPATITIS B SURFACE ANTIBODY,QUALITATIVE: Hep B S Ab: NONREACTIVE

## 2020-12-09 NOTE — Progress Notes (Signed)
Gastroenterology Consultation  Referring Provider:     Kathrine Haddock, NP Primary Care Physician:  No primary care provider on file. Primary Gastroenterologist:  Dr. Allen Norris     Reason for Consultation:     Cirrhosis        HPI:   Jonathan Griffin is a 55 y.o. y/o male referred for consultation & management of Cirrhosis by Dr. Rayne Du primary care provider on file..  The interview today is done through a medical interpreter. This patient comes to me today after being seen in the ER with bloating.  The patient was seen at the end of September and had reported progressive abdominal distention over the previous 2 weeks.  The patient had a CT scan of the abdomen that showed:  IMPRESSION: Hepatic cirrhosis. Moderate ascites is noted. Small fluid-filled periumbilical hernia.  In the ER he had been reported to have 14 beers per week. He had imaging back in 2011 that showed fatty infiltration of his liver.The patient had a paracentesis and examination of his ascitic fluid back on October 29 that showed it to be consistent with portal hypertension without any sign of SBP. At that time his AST was elevated at 44 with a normal ALT and his alkaline phosphatase was 200. The patient's platelets were also noted to be normal. This was done with the patient was seen in the ER at [facility] and was recommended on discharge to follow-up with me today.   When discussing through an interpreter with the patient today he admits to drinking 6-8 beers every day for the last 15 years.  He reports that he stopped drinking 3 months ago at the same time he stopped smoking.  There is no report of any black stools or bloody stools.  Past Medical History:  Diagnosis Date   Diabetes mellitus without complication ([place])    Hyperlipemia    Hypertension     Past Surgical History:  Procedure Laterality Date   LIPOMA EXCISION     neck    Prior to Admission medications   Medication Sig Start Date End Date Taking?  Authorizing Provider  aspirin EC 81 MG tablet Take 1 tablet (81 mg total) by mouth daily. 05/04/16   Kathrine Haddock, NP  blood glucose meter kit and supplies Dispense based on patient and insurance preference. Use up to four times daily as directed. (FOR ICD-9 250.00, 250.01). 05/04/16   Kathrine Haddock, NP  lisinopril (PRINIVIL,ZESTRIL) 10 MG tablet Take 1 tablet (10 mg total) by mouth daily. 05/21/17   Kathrine Haddock, NP  naproxen (NAPROSYN) 500 MG tablet Take 1 tablet (500 mg total) by mouth 2 (two) times daily with a meal. 03/30/17   Johnn Hai, PA-C    Family History  Problem Relation Age of Onset   Diabetes Father    Hypertension Father      Social History   Tobacco Use   Smoking status: Every Day    Types: Cigarettes   Smokeless tobacco: Former  Substance Use Topics   Alcohol use: Yes    Alcohol/week: 14.0 standard drinks    Types: 14 Cans of beer per week   Drug use: No    Allergies as of 12/09/2020   (No Known Allergies)    Review of Systems:    All systems reviewed and negative except where noted in HPI.   Physical Exam:  There were no vitals taken for this visit. No LMP for male patient. General:   Alert,  Well-developed,  well-nourished, pleasant and cooperative in NAD Head:  Normocephalic and atraumatic. Eyes:  Sclera clear, no icterus.   Conjunctiva pink. Ears:  Normal auditory acuity. Neck:  Supple; no masses or thyromegaly. Lungs:  Respirations even and unlabored.  Clear throughout to auscultation.   No wheezes, crackles, or rhonchi. No acute distress. Heart:  Regular rate and rhythm; no murmurs, clicks, rubs, or gallops. Abdomen:  Normal bowel sounds.  No bruits.  Soft, non-tender and non-distended without masses, hepatosplenomegaly or hernias noted.  No guarding or rebound tenderness.  Negative Carnett sign.   Rectal:  Deferred.  Pulses:  Normal pulses noted. Extremities:  No clubbing or edema.  No cyanosis. Neurologic:  Alert and oriented x3;  grossly  normal neurologically. Skin:  Intact without significant lesions or rashes.  No jaundice. Lymph Nodes:  No significant cervical adenopathy. Psych:  Alert and cooperative. Normal mood and affect.  Imaging Studies: No results found.  Assessment and Plan:   Jonathan Griffin is a 55 y.o. y/o male who comes in today with a history of alcohol abuse and imaging that shows cirrhosis. The patient has findings consistent with portal hypertension through his paracentesis and his increased SAAG. The patient's platelets and albumin have been normal and he has had elevated alkaline phosphatase for at least 5 years.  The patient was started on Lasix and Aldactone in the ER. His iron studies showed a iron saturation of 5%.  He reports that he's never had a colonoscopy.  The patient will be set up for an EGD for checking for varices.  The patient will also be set up for colonoscopy due to his anemia. The patient has been explained the need for these procedures and will have blood work sent off for other possible causes of abnormal liver enzymes and cirrhosis.  He also have a Doppler ultrasound of his liver to rule out thrombosis as the cause of his acute ascites. The patient has been explained the plan and agrees with it.    Lucilla Lame, MD. Marval Regal    Note: This dictation was prepared with Dragon dictation along with smaller phrase technology. Any transcriptional errors that result from this process are unintentional.

## 2020-12-09 NOTE — H&P (View-Only) (Signed)
Gastroenterology Consultation  Referring Provider:     Kathrine Haddock, NP Primary Care Physician:  No primary care provider on file. Primary Gastroenterologist:  Dr. Allen Norris     Reason for Consultation:     Cirrhosis        HPI:   Jonathan Griffin is a 55 y.o. y/o male referred for consultation & management of Cirrhosis by Dr. Rayne Du primary care provider on file..  The interview today is done through a medical interpreter. This patient comes to me today after being seen in the ER with bloating.  The patient was seen at the end of September and had reported progressive abdominal distention over the previous 2 weeks.  The patient had a CT scan of the abdomen that showed:  IMPRESSION: Hepatic cirrhosis. Moderate ascites is noted. Small fluid-filled periumbilical hernia.  In the ER he had been reported to have 14 beers per week. He had imaging back in 2011 that showed fatty infiltration of his liver.The patient had a paracentesis and examination of his ascitic fluid back on October 29 that showed it to be consistent with portal hypertension without any sign of SBP. At that time his AST was elevated at 44 with a normal ALT and his alkaline phosphatase was 200. The patient's platelets were also noted to be normal. This was done with the patient was seen in the ER at [facility] and was recommended on discharge to follow-up with me today.   When discussing through an interpreter with the patient today he admits to drinking 6-8 beers every day for the last 15 years.  He reports that he stopped drinking 3 months ago at the same time he stopped smoking.  There is no report of any black stools or bloody stools.  Past Medical History:  Diagnosis Date   Diabetes mellitus without complication ([place])    Hyperlipemia    Hypertension     Past Surgical History:  Procedure Laterality Date   LIPOMA EXCISION     neck    Prior to Admission medications   Medication Sig Start Date End Date Taking?  Authorizing Provider  aspirin EC 81 MG tablet Take 1 tablet (81 mg total) by mouth daily. 05/04/16   Kathrine Haddock, NP  blood glucose meter kit and supplies Dispense based on patient and insurance preference. Use up to four times daily as directed. (FOR ICD-9 250.00, 250.01). 05/04/16   Kathrine Haddock, NP  lisinopril (PRINIVIL,ZESTRIL) 10 MG tablet Take 1 tablet (10 mg total) by mouth daily. 05/21/17   Kathrine Haddock, NP  naproxen (NAPROSYN) 500 MG tablet Take 1 tablet (500 mg total) by mouth 2 (two) times daily with a meal. 03/30/17   Johnn Hai, PA-C    Family History  Problem Relation Age of Onset   Diabetes Father    Hypertension Father      Social History   Tobacco Use   Smoking status: Every Day    Types: Cigarettes   Smokeless tobacco: Former  Substance Use Topics   Alcohol use: Yes    Alcohol/week: 14.0 standard drinks    Types: 14 Cans of beer per week   Drug use: No    Allergies as of 12/09/2020   (No Known Allergies)    Review of Systems:    All systems reviewed and negative except where noted in HPI.   Physical Exam:  There were no vitals taken for this visit. No LMP for male patient. General:   Alert,  Well-developed,  well-nourished, pleasant and cooperative in NAD Head:  Normocephalic and atraumatic. Eyes:  Sclera clear, no icterus.   Conjunctiva pink. Ears:  Normal auditory acuity. Neck:  Supple; no masses or thyromegaly. Lungs:  Respirations even and unlabored.  Clear throughout to auscultation.   No wheezes, crackles, or rhonchi. No acute distress. Heart:  Regular rate and rhythm; no murmurs, clicks, rubs, or gallops. Abdomen:  Normal bowel sounds.  No bruits.  Soft, non-tender and non-distended without masses, hepatosplenomegaly or hernias noted.  No guarding or rebound tenderness.  Negative Carnett sign.   Rectal:  Deferred.  Pulses:  Normal pulses noted. Extremities:  No clubbing or edema.  No cyanosis. Neurologic:  Alert and oriented x3;  grossly  normal neurologically. Skin:  Intact without significant lesions or rashes.  No jaundice. Lymph Nodes:  No significant cervical adenopathy. Psych:  Alert and cooperative. Normal mood and affect.  Imaging Studies: No results found.  Assessment and Plan:   Jonathan Griffin is a 55 y.o. y/o male who comes in today with a history of alcohol abuse and imaging that shows cirrhosis. The patient has findings consistent with portal hypertension through his paracentesis and his increased SAAG. The patient's platelets and albumin have been normal and he has had elevated alkaline phosphatase for at least 5 years.  The patient was started on Lasix and Aldactone in the ER. His iron studies showed a iron saturation of 5%.  He reports that he's never had a colonoscopy.  The patient will be set up for an EGD for checking for varices.  The patient will also be set up for colonoscopy due to his anemia. The patient has been explained the need for these procedures and will have blood work sent off for other possible causes of abnormal liver enzymes and cirrhosis.  He also have a Doppler ultrasound of his liver to rule out thrombosis as the cause of his acute ascites. The patient has been explained the plan and agrees with it.    Lucilla Lame, MD. Marval Regal    Note: This dictation was prepared with Dragon dictation along with smaller phrase technology. Any transcriptional errors that result from this process are unintentional.

## 2020-12-10 ENCOUNTER — Encounter: Payer: Self-pay | Admitting: Gastroenterology

## 2020-12-10 ENCOUNTER — Other Ambulatory Visit: Payer: Self-pay

## 2020-12-10 DIAGNOSIS — D500 Iron deficiency anemia secondary to blood loss (chronic): Secondary | ICD-10-CM

## 2020-12-10 DIAGNOSIS — I8500 Esophageal varices without bleeding: Secondary | ICD-10-CM

## 2020-12-10 LAB — ALPHA-1-ANTITRYPSIN: A-1 Antitrypsin, Ser: 193 mg/dL — ABNORMAL HIGH (ref 101–187)

## 2020-12-10 LAB — MITOCHONDRIAL ANTIBODIES: Mitochondrial M2 Ab, IgG: <20 Units (ref 0.0–20.0)

## 2020-12-10 LAB — ANTI-SMOOTH MUSCLE ANTIBODY, IGG: F-Actin IgG: 8 Units (ref 0–19)

## 2020-12-10 LAB — CERULOPLASMIN: Ceruloplasmin: 26.8 mg/dL (ref 16.0–31.0)

## 2020-12-11 ENCOUNTER — Encounter: Payer: Self-pay | Admitting: Gastroenterology

## 2020-12-12 ENCOUNTER — Ambulatory Visit: Payer: Self-pay | Admitting: Anesthesiology

## 2020-12-12 ENCOUNTER — Encounter
Admission: RE | Disposition: A | Discharge status: Home / Self Care | Payer: Self-pay | Source: Home / Self Care | Attending: Gastroenterology

## 2020-12-12 ENCOUNTER — Ambulatory Visit
Admission: RE | Admit: 2020-12-12 | Discharge: 2020-12-12 | Disposition: A | Discharge status: Home / Self Care | Payer: Self-pay | Attending: Gastroenterology | Admitting: Gastroenterology

## 2020-12-12 ENCOUNTER — Encounter: Payer: Self-pay | Admitting: Gastroenterology

## 2020-12-12 ENCOUNTER — Other Ambulatory Visit: Payer: Self-pay

## 2020-12-12 DIAGNOSIS — I8500 Esophageal varices without bleeding: Secondary | ICD-10-CM

## 2020-12-12 DIAGNOSIS — D500 Iron deficiency anemia secondary to blood loss (chronic): Secondary | ICD-10-CM

## 2020-12-12 DIAGNOSIS — E119 Type 2 diabetes mellitus without complications: Secondary | ICD-10-CM | POA: Insufficient documentation

## 2020-12-12 DIAGNOSIS — R188 Other ascites: Secondary | ICD-10-CM | POA: Insufficient documentation

## 2020-12-12 DIAGNOSIS — Z87891 Personal history of nicotine dependence: Secondary | ICD-10-CM | POA: Insufficient documentation

## 2020-12-12 DIAGNOSIS — K922 Gastrointestinal hemorrhage, unspecified: Secondary | ICD-10-CM | POA: Insufficient documentation

## 2020-12-12 DIAGNOSIS — F1011 Alcohol abuse, in remission: Secondary | ICD-10-CM | POA: Insufficient documentation

## 2020-12-12 DIAGNOSIS — I8510 Secondary esophageal varices without bleeding: Secondary | ICD-10-CM | POA: Insufficient documentation

## 2020-12-12 DIAGNOSIS — G4730 Sleep apnea, unspecified: Secondary | ICD-10-CM | POA: Insufficient documentation

## 2020-12-12 DIAGNOSIS — K7460 Unspecified cirrhosis of liver: Secondary | ICD-10-CM | POA: Insufficient documentation

## 2020-12-12 DIAGNOSIS — I10 Essential (primary) hypertension: Secondary | ICD-10-CM | POA: Insufficient documentation

## 2020-12-12 DIAGNOSIS — D649 Anemia, unspecified: Secondary | ICD-10-CM | POA: Insufficient documentation

## 2020-12-12 HISTORY — PX: ESOPHAGOGASTRODUODENOSCOPY (EGD) WITH PROPOFOL: SHX5813

## 2020-12-12 HISTORY — DX: Other ascites: R18.8

## 2020-12-12 HISTORY — DX: Unspecified cirrhosis of liver: K74.60

## 2020-12-12 HISTORY — PX: ESOPHAGEAL BANDING: SHX5518

## 2020-12-12 LAB — GLUCOSE, CAPILLARY
Glucose-Capillary: 162 mg/dL — ABNORMAL HIGH (ref 70–99)
Glucose-Capillary: 137 mg/dL — ABNORMAL HIGH (ref 70–99)

## 2020-12-12 SURGERY — ESOPHAGOGASTRODUODENOSCOPY (EGD) WITH PROPOFOL
Anesthesia: General

## 2020-12-12 MED ORDER — ACETAMINOPHEN 325 MG PO TABS: 325.0000 mg | ORAL_TABLET | ORAL | Status: DC | PRN

## 2020-12-12 MED ORDER — ACETAMINOPHEN 160 MG/5ML PO SOLN: 325.0000 mg | ORAL | Status: DC | PRN

## 2020-12-12 MED ORDER — LACTATED RINGERS IV SOLN: INTRAVENOUS | Status: DC

## 2020-12-12 MED ORDER — GLYCOPYRROLATE 0.2 MG/ML IJ SOLN
INTRAMUSCULAR | Status: DC | PRN
Start: 2020-12-12 — End: 2020-12-12
  Administered 2020-12-12: .1 mg via INTRAVENOUS

## 2020-12-12 MED ORDER — SPIRONOLACTONE 100 MG PO TABS: 100.0000 mg | ORAL_TABLET | Freq: Every day | ORAL | 11 refills | Status: DC

## 2020-12-12 MED ORDER — SODIUM CHLORIDE 0.9 % IV SOLN: INTRAVENOUS | Status: DC

## 2020-12-12 MED ORDER — ONDANSETRON HCL 4 MG/2ML IJ SOLN: 4.0000 mg | Freq: Once | INTRAMUSCULAR | Status: DC | PRN

## 2020-12-12 MED ORDER — PROPOFOL 10 MG/ML IV BOLUS
INTRAVENOUS | Status: DC | PRN
  Administered 2020-12-12: 150 mg via INTRAVENOUS
  Administered 2020-12-12: 30 mg via INTRAVENOUS
  Administered 2020-12-12 (×2): 20 mg via INTRAVENOUS

## 2020-12-12 MED ORDER — FUROSEMIDE 40 MG PO TABS: 40.0000 mg | ORAL_TABLET | Freq: Every day | ORAL | 11 refills | Status: DC

## 2020-12-12 MED ORDER — LIDOCAINE HCL (CARDIAC) PF 100 MG/5ML IV SOSY
PREFILLED_SYRINGE | INTRAVENOUS | Status: DC | PRN
  Administered 2020-12-12: 30 mg via INTRAVENOUS

## 2020-12-12 SURGICAL SUPPLY — 39 items
BALLN DILATOR 10-12 8 (BALLOONS)
BALLN DILATOR 12-15 8 (BALLOONS)
BALLN DILATOR 15-18 8 (BALLOONS)
BALLN DILATOR CRE 0-12 8 (BALLOONS)
BALLN DILATOR ESOPH 8 10 CRE (MISCELLANEOUS) IMPLANT
BALLOON DILATOR 12-15 8 (BALLOONS) IMPLANT
BALLOON DILATOR 15-18 8 (BALLOONS) IMPLANT
BALLOON DILATOR CRE 0-12 8 (BALLOONS) IMPLANT
BAND LIGATOR SUPER 7 2.8 (MISCELLANEOUS) ×3 IMPLANT
BLOCK BITE 60FR ADLT L/F GRN (MISCELLANEOUS) ×3 IMPLANT
CLIP HMST 235XBRD CATH ROT (MISCELLANEOUS) IMPLANT
CLIP RESOLUTION 360 11X235 (MISCELLANEOUS)
ELECT REM PT RETURN 9FT ADLT (ELECTROSURGICAL)
ELECTRODE REM PT RTRN 9FT ADLT (ELECTROSURGICAL) IMPLANT
FCP ESCP3.2XJMB 240X2.8X (MISCELLANEOUS)
FORCEPS BIOP RAD 4 LRG CAP 4 (CUTTING FORCEPS) IMPLANT
FORCEPS BIOP RJ4 240 W/NDL (MISCELLANEOUS)
FORCEPS ESCP3.2XJMB 240X2.8X (MISCELLANEOUS) IMPLANT
GOWN CVR UNV OPN BCK APRN NK (MISCELLANEOUS) ×4 IMPLANT
GOWN ISOL THUMB LOOP REG UNIV (MISCELLANEOUS) ×6
INJECTOR VARIJECT VIN23 (MISCELLANEOUS) IMPLANT
KIT DEFENDO VALVE AND CONN (KITS) IMPLANT
KIT PRC NS LF DISP ENDO (KITS) ×2 IMPLANT
KIT PROCEDURE OLYMPUS (KITS) ×3
MANIFOLD NEPTUNE II (INSTRUMENTS) ×3 IMPLANT
MARKER SPOT ENDO TATTOO 5ML (MISCELLANEOUS) IMPLANT
PROBE APC STR FIRE (PROBE) IMPLANT
RETRIEVER NET PLAT FOOD (MISCELLANEOUS) IMPLANT
RETRIEVER NET ROTH 2.5X230 LF (MISCELLANEOUS) IMPLANT
SNARE COLD EXACTO (MISCELLANEOUS) IMPLANT
SNARE SHORT THROW 13M SML OVAL (MISCELLANEOUS) IMPLANT
SNARE SHORT THROW 30M LRG OVAL (MISCELLANEOUS) IMPLANT
SNARE SNG USE RND 15MM (INSTRUMENTS) IMPLANT
SPOT EX ENDOSCOPIC TATTOO (MISCELLANEOUS)
SYR INFLATION 60ML (SYRINGE) IMPLANT
TRAP ETRAP POLY (MISCELLANEOUS) IMPLANT
VARIJECT INJECTOR VIN23 (MISCELLANEOUS)
WATER STERILE IRR 250ML POUR (IV SOLUTION) ×3 IMPLANT
WIRE CRE 18-20MM 8CM F G (MISCELLANEOUS) IMPLANT

## 2020-12-12 NOTE — Op Note (Signed)
[facility] Gastroenterology Patient Name: Jonathan Griffin Procedure Date: 12/12/2020 11:12 AM MRN: [id number] Account #: [id number] Date of Birth: Sep 04, 1965 Admit Type: Outpatient Age: 55 Room: [facility] OR ROOM 01 Gender: Male Note Status: Finalized Instrument Name: [id number] Procedure:             Upper GI endoscopy Indications:           Cirrhosis with UGI bleeding rule out esophageal varices Providers:             Midge Minium MD, MD Medicines:             Propofol per Anesthesia Complications:         No immediate complications. Procedure:             Pre-Anesthesia Assessment:                        - Prior to the procedure, a History and Physical was                         performed, and patient medications and allergies were                         reviewed. The patient's tolerance of previous                         anesthesia was also reviewed. The risks and benefits                         of the procedure and the sedation options and risks                         were discussed with the patient. All questions were                         answered, and informed consent was obtained. Prior                         Anticoagulants: The patient has taken no previous                         anticoagulant or antiplatelet agents. ASA Grade                         Assessment: III - A patient with severe systemic                         disease. After reviewing the risks and benefits, the                         patient was deemed in satisfactory condition to                         undergo the procedure.                        After obtaining informed consent, the endoscope was                         passed under direct vision. Throughout  the procedure,                         the patient's blood pressure, pulse, and oxygen                         saturations were monitored continuously. The Endoscope                         was introduced through the mouth, and  advanced to the                         second part of duodenum. The upper GI endoscopy was                         accomplished without difficulty. The patient tolerated                         the procedure well. Findings:      Grade III varices were found in the lower third of the esophagus. Three       bands were successfully placed with incomplete eradication of varices.       There was no bleeding during the procedure.      The stomach was normal.      The examined duodenum was normal. Impression:            - Grade III esophageal varices. Incompletely                         eradicated. Banded.                        - Normal stomach.                        - Normal examined duodenum.                        - No specimens collected. Recommendation:        - Soft diet for 2 days.                        - Discharge patient to home.                        - Perform a colonoscopy.                        - Repeat upper endoscopy in 4 weeks for retreatment. Procedure Code(s):     --- Professional ---                        [id number], Esophagogastroduodenoscopy, flexible,                         transoral; with band ligation of esophageal/gastric                         varices Diagnosis Code(s):     --- Professional ---  I85.10, Secondary esophageal varices without bleeding CPT copyright 2019 American Medical Association. All rights reserved. The codes documented in this report are preliminary and upon coder review may  be revised to meet current compliance requirements. Midge Minium MD, MD 12/12/2020 11:31:37 AM This report has been signed electronically. Number of Addenda: 0 Note Initiated On: 12/12/2020 11:12 AM Total Procedure Duration: 0 hours 6 minutes 17 seconds  Estimated Blood Loss:  Estimated blood loss: none.      [facility]

## 2020-12-12 NOTE — Anesthesia Preprocedure Evaluation (Signed)
Anesthesia Evaluation  Patient identified by MRN, date of birth, ID band Patient awake    Reviewed: Allergy & Precautions, NPO status   Airway Mallampati: II  TM Distance: >3 FB     Dental   Pulmonary sleep apnea , former smoker,    Pulmonary exam normal        Cardiovascular hypertension,  Rhythm:Regular Rate:Normal  HLD   Neuro/Psych    GI/Hepatic (+) Cirrhosis       ,   Endo/Other  diabetes  Renal/GU      Musculoskeletal   Abdominal   Peds  Hematology  (+) anemia ,   Anesthesia Other Findings   Reproductive/Obstetrics                             Anesthesia Physical Anesthesia Plan  ASA: 3  Anesthesia Plan: General   Post-op Pain Management:    Induction: Intravenous  PONV Risk Score and Plan: Propofol infusion, TIVA and Treatment may vary due to age or medical condition  Airway Management Planned: Natural Airway and Nasal Cannula  Additional Equipment:   Intra-op Plan:   Post-operative Plan:   Informed Consent: I have reviewed the patients History and Physical, chart, labs and discussed the procedure including the risks, benefits and alternatives for the proposed anesthesia with the patient or authorized representative who has indicated his/her understanding and acceptance.       Plan Discussed with: CRNA  Anesthesia Plan Comments:         Anesthesia Quick Evaluation

## 2020-12-12 NOTE — Transfer of Care (Addendum)
Immediate Anesthesia Transfer of Care Note  Patient: Jonathan Griffin  Procedure(s) Performed: ESOPHAGOGASTRODUODENOSCOPY (EGD) WITH PROPOFOL ESOPHAGEAL BANDING x 3 bands  Patient Location: PACU  Anesthesia Type: General  Level of Consciousness: awake, alert  and patient cooperative  Airway and Oxygen Therapy: Patient Spontanous Breathing and Patient connected to supplemental oxygen  Post-op Assessment: Post-op Vital signs reviewed, Patient's Cardiovascular Status Stable, Respiratory Function Stable, Patent Airway and No signs of Nausea or vomiting  Post-op Vital Signs: Reviewed and stable  Complications: No notable events documented.

## 2020-12-12 NOTE — Anesthesia Postprocedure Evaluation (Signed)
Anesthesia Post Note  Patient: Jonathan Griffin  Procedure(s) Performed: ESOPHAGOGASTRODUODENOSCOPY (EGD) WITH PROPOFOL ESOPHAGEAL BANDING x 3 bands     Patient location during evaluation: PACU Anesthesia Type: General Level of consciousness: awake Pain management: pain level controlled Vital Signs Assessment: post-procedure vital signs reviewed and stable Respiratory status: respiratory function stable Cardiovascular status: stable Postop Assessment: no signs of nausea or vomiting Anesthetic complications: no   No notable events documented.  Jola Babinski

## 2020-12-12 NOTE — Interval H&P Note (Signed)
Lucilla Lame, MD [address], [place] [address] [phone] Fax : [phone]  Primary Care Physician:  Pcp, No Primary Gastroenterologist:  Dr. Allen Norris  Pre-Procedure History & Physical: HPI:  Jonathan Griffin is a 55 y.o. male is here for an endoscopy.   Past Medical History:  Diagnosis Date   Cirrhosis of liver with ascites ([place])    Diabetes mellitus without complication ([place])    Hyperlipemia    Hypertension     Past Surgical History:  Procedure Laterality Date   BACK SURGERY     LIPOMA EXCISION     neck    Prior to Admission medications   Medication Sig Start Date End Date Taking? Authorizing Provider  lisinopril (PRINIVIL,ZESTRIL) 10 MG tablet Take 1 tablet (10 mg total) by mouth daily. 05/21/17  Yes Kathrine Haddock, NP  metFORMIN (GLUCOPHAGE) 850 MG tablet Take 850 mg by mouth 2 (two) times daily with a meal.   Yes [provider]  aspirin EC 81 MG tablet Take 1 tablet (81 mg total) by mouth daily. Patient not taking: Reported on 12/09/2020 05/04/16   Kathrine Haddock, NP  blood glucose meter kit and supplies Dispense based on patient and insurance preference. Use up to four times daily as directed. (FOR ICD-9 250.00, 250.01). Patient not taking: Reported on 12/09/2020 05/04/16   Kathrine Haddock, NP  furosemide (LASIX) 40 MG tablet Take 40 mg by mouth daily. Patient not taking: Reported on 12/10/2020 11/03/20   [provider]  naproxen (NAPROSYN) 500 MG tablet Take 1 tablet (500 mg total) by mouth 2 (two) times daily with a meal. Patient not taking: Reported on 12/09/2020 03/30/17   Johnn Hai, PA-C  spironolactone (ALDACTONE) 100 MG tablet Take 100 mg by mouth daily. Patient not taking: Reported on 12/10/2020 11/03/20   [provider]    Allergies as of 12/10/2020   (No Known Allergies)    Family History  Problem Relation Age of Onset   Diabetes Father    Hypertension Father     Social History    Socioeconomic History   Marital status: Married    Spouse name: Not on file   Number of children: Not on file   Years of education: Not on file   Highest education level: Not on file  Occupational History   Not on file  Tobacco Use   Smoking status: Former    Packs/day: 0.10    Years: 40.00    Pack years: 4.00    Types: Cigarettes    Quit date: 10/03/2020    Years since quitting: 0.1   Smokeless tobacco: Former   Tobacco comments:    Started smoking 1-2 cigs/day around age 31.  Quit around 10/03/20  Vaping Use   Vaping Use: Never used  Substance and Sexual Activity   Alcohol use: Not Currently    Alcohol/week: 42.0 standard drinks    Types: 42 Cans of beer per week    Comment: Quit drinking around 10/03/20   Drug use: No   Sexual activity: Never  Other Topics Concern   Not on file  Social History Narrative   Not on file   Social Determinants of Health   Financial Resource Strain: Not on file  Food Insecurity: Not on file  Transportation Needs: Not on file  Physical Activity: Not on file  Stress: Not on file  Social Connections: Not on file  Intimate Partner Violence: Not on file    Review of Systems: See  HPI, otherwise negative ROS  Physical Exam: BP 126/89   Pulse 80   Temp (!) 97.3 F (36.3 C) (Temporal)   Ht 5' 6"  (1.676 m)   Wt 114.8 kg   SpO2 98%   BMI 40.84 kg/m  General:   Alert,  pleasant and cooperative in NAD Head:  Normocephalic and atraumatic. Neck:  Supple; no masses or thyromegaly. Lungs:  Clear throughout to auscultation.    Heart:  Regular rate and rhythm. Abdomen:  Soft, nontender and nondistended. Normal bowel sounds, without guarding, and without rebound.   Neurologic:  Alert and  oriented x4;  grossly normal neurologically.  Impression/Plan: Jonathan Griffin is here for an endoscopy to be performed for cirrhosis  Risks, benefits, limitations, and alternatives regarding  endoscopy have been reviewed with the patient.  Questions have  been answered.  All parties agreeable.   Lucilla Lame, MD  12/12/2020, 11:33 AM

## 2020-12-12 NOTE — Anesthesia Procedure Notes (Signed)
Date/Time: 12/12/2020 11:18 AM Performed by: Maree Krabbe, CRNA Pre-anesthesia Checklist: Patient identified, Emergency Drugs available, Suction available, Timeout performed and Patient being monitored Patient Re-evaluated:Patient Re-evaluated prior to induction Oxygen Delivery Method: Nasal cannula Placement Confirmation: positive ETCO2

## 2020-12-13 ENCOUNTER — Encounter: Payer: Self-pay | Admitting: Gastroenterology

## 2021-01-01 ENCOUNTER — Ambulatory Visit: Payer: BC Managed Care – PPO | Admitting: Nurse Practitioner

## 2021-01-22 ENCOUNTER — Other Ambulatory Visit: Payer: Self-pay

## 2021-01-22 ENCOUNTER — Telehealth: Payer: Self-pay

## 2021-01-22 DIAGNOSIS — D508 Other iron deficiency anemias: Secondary | ICD-10-CM

## 2021-01-22 DIAGNOSIS — I8500 Esophageal varices without bleeding: Secondary | ICD-10-CM

## 2021-01-22 DIAGNOSIS — D509 Iron deficiency anemia, unspecified: Secondary | ICD-10-CM

## 2021-01-22 NOTE — Telephone Encounter (Signed)
-----   Message from Rayann Heman, CMA sent at 01/20/2021  4:31 PM EST ----- Would you mind contacting this patient with results? He speaks spanish. ----- Message ----- From: Midge Minium, MD Sent: 12/19/2020   8:19 AM EST To: Rayann Heman, CMA  This patient needs a colonoscopy due to his iron deficiency anemia and at the same time repeat the upper endoscopy for further banding of his esophageal varices.  He also should have his blood sent off for a GGT and fractionation of his alkaline phosphatase.

## 2021-01-22 NOTE — Telephone Encounter (Signed)
Called patient and told him that he would need a colonoscopy and an EGD per Dr. Servando Snare. Patient agreed and he will do his procedures at our [facility] location on 01/30/2021. I instructed the patient as what he is to do to prep for his procedures and that I would leave him his instructions at the front desk. Patient agreed and stated that if he had any questions that he would let me know.

## 2021-01-23 ENCOUNTER — Other Ambulatory Visit: Payer: Self-pay

## 2021-01-23 ENCOUNTER — Encounter: Payer: Self-pay | Admitting: Gastroenterology

## 2021-01-23 MED ORDER — CLENPIQ 10-3.5-12 MG-GM -GM/160ML PO SOLN: 320.0000 mL | ORAL | 0 refills | Status: DC

## 2021-01-28 NOTE — Telephone Encounter (Signed)
error 

## 2021-01-30 ENCOUNTER — Other Ambulatory Visit: Payer: Self-pay

## 2021-01-30 ENCOUNTER — Encounter: Payer: Self-pay | Admitting: Gastroenterology

## 2021-01-30 MED ORDER — PEG 3350-KCL-NA BICARB-NACL 420 G PO SOLR: 4000.0000 mL | Freq: Once | ORAL | 0 refills | Status: AC

## 2021-01-30 NOTE — Progress Notes (Signed)
Patient needs different prep sent to pharmacy. I will send instructions via my chart.

## 2021-02-06 ENCOUNTER — Ambulatory Visit: Payer: Self-pay | Admitting: Anesthesiology

## 2021-02-06 ENCOUNTER — Encounter
Admission: RE | Disposition: A | Discharge status: Home / Self Care | Payer: Self-pay | Source: Home / Self Care | Attending: Gastroenterology

## 2021-02-06 ENCOUNTER — Other Ambulatory Visit: Payer: Self-pay

## 2021-02-06 ENCOUNTER — Ambulatory Visit
Admission: RE | Admit: 2021-02-06 | Discharge: 2021-02-06 | Disposition: A | Discharge status: Home / Self Care | Payer: Self-pay | Attending: Gastroenterology | Admitting: Gastroenterology

## 2021-02-06 ENCOUNTER — Encounter: Payer: Self-pay | Admitting: Gastroenterology

## 2021-02-06 DIAGNOSIS — I8500 Esophageal varices without bleeding: Secondary | ICD-10-CM

## 2021-02-06 DIAGNOSIS — K766 Portal hypertension: Secondary | ICD-10-CM

## 2021-02-06 DIAGNOSIS — D759 Disease of blood and blood-forming organs, unspecified: Secondary | ICD-10-CM | POA: Insufficient documentation

## 2021-02-06 DIAGNOSIS — I10 Essential (primary) hypertension: Secondary | ICD-10-CM | POA: Insufficient documentation

## 2021-02-06 DIAGNOSIS — G4730 Sleep apnea, unspecified: Secondary | ICD-10-CM | POA: Insufficient documentation

## 2021-02-06 DIAGNOSIS — Z6837 Body mass index (BMI) 37.0-37.9, adult: Secondary | ICD-10-CM | POA: Insufficient documentation

## 2021-02-06 DIAGNOSIS — Z1211 Encounter for screening for malignant neoplasm of colon: Secondary | ICD-10-CM

## 2021-02-06 DIAGNOSIS — D509 Iron deficiency anemia, unspecified: Secondary | ICD-10-CM

## 2021-02-06 DIAGNOSIS — E669 Obesity, unspecified: Secondary | ICD-10-CM | POA: Insufficient documentation

## 2021-02-06 DIAGNOSIS — I8510 Secondary esophageal varices without bleeding: Secondary | ICD-10-CM | POA: Insufficient documentation

## 2021-02-06 DIAGNOSIS — K7460 Unspecified cirrhosis of liver: Secondary | ICD-10-CM | POA: Insufficient documentation

## 2021-02-06 DIAGNOSIS — K3189 Other diseases of stomach and duodenum: Secondary | ICD-10-CM | POA: Insufficient documentation

## 2021-02-06 DIAGNOSIS — Z87891 Personal history of nicotine dependence: Secondary | ICD-10-CM | POA: Insufficient documentation

## 2021-02-06 DIAGNOSIS — E119 Type 2 diabetes mellitus without complications: Secondary | ICD-10-CM | POA: Insufficient documentation

## 2021-02-06 DIAGNOSIS — D649 Anemia, unspecified: Secondary | ICD-10-CM | POA: Insufficient documentation

## 2021-02-06 HISTORY — PX: ESOPHAGOGASTRODUODENOSCOPY: SHX5428

## 2021-02-06 HISTORY — PX: ESOPHAGEAL BANDING: SHX5518

## 2021-02-06 HISTORY — PX: COLONOSCOPY WITH PROPOFOL: SHX5780

## 2021-02-06 LAB — GLUCOSE, CAPILLARY
Glucose-Capillary: 142 mg/dL — ABNORMAL HIGH (ref 70–99)
Glucose-Capillary: 121 mg/dL — ABNORMAL HIGH (ref 70–99)

## 2021-02-06 SURGERY — EGD (ESOPHAGOGASTRODUODENOSCOPY)
Anesthesia: General | Site: Throat

## 2021-02-06 MED ORDER — SODIUM CHLORIDE 0.9 % IV SOLN: INTRAVENOUS | Status: DC

## 2021-02-06 MED ORDER — PROPOFOL 10 MG/ML IV BOLUS
INTRAVENOUS | Status: DC | PRN
  Administered 2021-02-06 (×2): 30 mg via INTRAVENOUS
  Administered 2021-02-06: 80 mg via INTRAVENOUS
  Administered 2021-02-06 (×10): 30 mg via INTRAVENOUS
  Administered 2021-02-06: 20 mg via INTRAVENOUS
  Administered 2021-02-06 (×3): 30 mg via INTRAVENOUS

## 2021-02-06 MED ORDER — STERILE WATER FOR IRRIGATION IR SOLN
Status: DC | PRN
  Administered 2021-02-06: 500 mL

## 2021-02-06 MED ORDER — LACTATED RINGERS IV SOLN: INTRAVENOUS | Status: DC

## 2021-02-06 MED ORDER — GLYCOPYRROLATE 0.2 MG/ML IJ SOLN
INTRAMUSCULAR | Status: DC | PRN
  Administered 2021-02-06: .1 mg via INTRAVENOUS

## 2021-02-06 SURGICAL SUPPLY — 8 items
BAND LIGATOR SUPER 7 2.8 (MISCELLANEOUS) ×1 IMPLANT
BLOCK BITE 60FR ADLT L/F GRN (MISCELLANEOUS) ×4 IMPLANT
GOWN CVR UNV OPN BCK APRN NK (MISCELLANEOUS) ×12 IMPLANT
GOWN ISOL THUMB LOOP REG UNIV (MISCELLANEOUS) ×16
KIT PRC NS LF DISP ENDO (KITS) ×3 IMPLANT
KIT PROCEDURE OLYMPUS (KITS) ×8
MANIFOLD NEPTUNE II (INSTRUMENTS) ×4 IMPLANT
WATER STERILE IRR 500ML POUR (IV SOLUTION) ×1 IMPLANT

## 2021-02-06 NOTE — Anesthesia Postprocedure Evaluation (Signed)
Anesthesia Post Note  Patient: Jonathan Griffin  Procedure(s) Performed: ESOPHAGOGASTRODUODENOSCOPY (EGD) (Throat) ESOPHAGEAL BANDING (Throat) COLONOSCOPY WITH PROPOFOL (Rectum)     Patient location during evaluation: PACU Anesthesia Type: General Level of consciousness: awake and alert and oriented Pain management: satisfactory to patient Vital Signs Assessment: post-procedure vital signs reviewed and stable Respiratory status: spontaneous breathing, nonlabored ventilation and respiratory function stable Cardiovascular status: blood pressure returned to baseline and stable Postop Assessment: Adequate PO intake and No signs of nausea or vomiting Anesthetic complications: no   No notable events documented.  Cherly Beach

## 2021-02-06 NOTE — Anesthesia Preprocedure Evaluation (Signed)
Anesthesia Evaluation  Patient identified by MRN, date of birth, ID band Patient awake    Reviewed: Allergy & Precautions, H&P , NPO status , Patient's Chart, lab work & pertinent test results  Airway Mallampati: II  TM Distance: >3 FB Neck ROM: full    Dental no notable dental hx. (+) Caps   Pulmonary sleep apnea , former smoker,    Pulmonary exam normal breath sounds clear to auscultation       Cardiovascular hypertension, Normal cardiovascular exam Rhythm:regular Rate:Normal  HLD   Neuro/Psych    GI/Hepatic (+) Cirrhosis   ascites    ,   Endo/Other  diabetesObesity  Renal/GU      Musculoskeletal   Abdominal   Peds  Hematology  (+) Blood dyscrasia, anemia ,   Anesthesia Other Findings   Reproductive/Obstetrics                             Anesthesia Physical  Anesthesia Plan  ASA: 3  Anesthesia Plan: General   Post-op Pain Management: Minimal or no pain anticipated   Induction: Intravenous  PONV Risk Score and Plan: 2 and Treatment may vary due to age or medical condition, Propofol infusion and TIVA  Airway Management Planned: Natural Airway and Nasal Cannula  Additional Equipment:   Intra-op Plan:   Post-operative Plan:   Informed Consent: I have reviewed the patients History and Physical, chart, labs and discussed the procedure including the risks, benefits and alternatives for the proposed anesthesia with the patient or authorized representative who has indicated his/her understanding and acceptance.     Dental Advisory Given  Plan Discussed with: CRNA  Anesthesia Plan Comments:         Anesthesia Quick Evaluation

## 2021-02-06 NOTE — Anesthesia Procedure Notes (Signed)
Date/Time: 02/06/2021 9:50 AM Performed by: Jimmy Picket, CRNA Pre-anesthesia Checklist: Patient identified, Emergency Drugs available, Suction available, Timeout performed and Patient being monitored Patient Re-evaluated:Patient Re-evaluated prior to induction Oxygen Delivery Method: Nasal cannula Placement Confirmation: positive ETCO2

## 2021-02-06 NOTE — Op Note (Signed)
[facility] Patient Name: Jonathan Griffin Procedure Date: 02/06/2021 9:41 AM MRN: [id number] Account #: [id number] Date of Birth: 1965/12/18 Admit Type: Outpatient Age: 56 Room: [facility] OR ROOM 1 Gender: Male Note Status: Finalized Instrument Name: [id number] Procedure:             Colonoscopy Indications:           Screening for colorectal malignant neoplasm Providers:             Midge Minium MD, MD Medicines:             Propofol per Anesthesia Complications:         No immediate complications. Procedure:             Pre-Anesthesia Assessment:                        - Prior to the procedure, a History and Physical was                         performed, and patient medications and allergies were                         reviewed. The patient's tolerance of previous                         anesthesia was also reviewed. The risks and benefits                         of the procedure and the sedation options and risks                         were discussed with the patient. All questions were                         answered, and informed consent was obtained. Prior                         Anticoagulants: The patient has taken no previous                         anticoagulant or antiplatelet agents. ASA Grade                         Assessment: II - A patient with mild systemic disease.                         After reviewing the risks and benefits, the patient                         was deemed in satisfactory condition to undergo the                         procedure.                        After obtaining informed consent, the colonoscope was                         passed under direct vision. Throughout the procedure,  the patient's blood pressure, pulse, and oxygen                         saturations were monitored continuously. The                         Colonoscope was introduced through the anus and                          advanced to the the cecum, identified by appendiceal                         orifice and ileocecal valve. The colonoscopy was                         performed without difficulty. The patient tolerated                         the procedure well. The quality of the bowel                         preparation was good. Findings:      The perianal and digital rectal examinations were normal.      The entire examined colon appeared normal. Impression:            - The entire examined colon is normal.                        - No specimens collected. Recommendation:        - Discharge patient to home.                        - Resume previous diet.                        - Continue present medications.                        - Repeat colonoscopy in 10 years for screening                         purposes. Procedure Code(s):     --- Professional ---                        [phone], Colonoscopy, flexible; diagnostic, including                         collection of specimen(s) by brushing or washing, when                         performed (separate procedure) Diagnosis Code(s):     --- Professional ---                        Z12.11, Encounter for screening for malignant neoplasm                         of colon CPT copyright 2019 American Medical Association. All rights reserved. The codes documented in this report are preliminary and upon coder review may  be revised  to meet current compliance requirements. Midge Miniumarren Jomaira Darr MD, MD 02/06/2021 10:17:52 AM This report has been signed electronically. Number of Addenda: 0 Note Initiated On: 02/06/2021 9:41 AM Scope Withdrawal Time: 0 hours 10 minutes 1 second  Total Procedure Duration: 0 hours 13 minutes 12 seconds  Estimated Blood Loss:  Estimated blood loss: none.      [facility]

## 2021-02-06 NOTE — Transfer of Care (Signed)
Immediate Anesthesia Transfer of Care Note  Patient: Jonathan Griffin  Procedure(s) Performed: ESOPHAGOGASTRODUODENOSCOPY (EGD) (Throat) ESOPHAGEAL BANDING (Throat) COLONOSCOPY WITH PROPOFOL (Rectum)  Patient Location: PACU  Anesthesia Type: General  Level of Consciousness: awake, alert  and patient cooperative  Airway and Oxygen Therapy: Patient Spontanous Breathing and Patient connected to supplemental oxygen  Post-op Assessment: Post-op Vital signs reviewed, Patient's Cardiovascular Status Stable, Respiratory Function Stable, Patent Airway and No signs of Nausea or vomiting  Post-op Vital Signs: Reviewed and stable  Complications: No notable events documented.

## 2021-02-06 NOTE — H&P (Signed)
Lucilla Lame, MD [address] [place], [place] [address] Phone:[phone] Fax : [phone]  Primary Care Physician:  Pcp, No Primary Gastroenterologist:  Dr. Allen Norris  Pre-Procedure History & Physical: HPI:  Jonathan Griffin is a 56 y.o. male is here for an endoscopy and colonoscopy.   Past Medical History:  Diagnosis Date   Cirrhosis of liver with ascites ([place])    Diabetes mellitus without complication ([place])    Hyperlipemia    Hypertension     Past Surgical History:  Procedure Laterality Date   BACK SURGERY     ESOPHAGEAL BANDING  12/12/2020   Procedure: ESOPHAGEAL BANDING x 3 bands;  Surgeon: Lucilla Lame, MD;  Location: [facility];  Service: Endoscopy;;   ESOPHAGOGASTRODUODENOSCOPY (EGD) WITH PROPOFOL N/A 12/12/2020   Procedure: ESOPHAGOGASTRODUODENOSCOPY (EGD) WITH PROPOFOL;  Surgeon: Lucilla Lame, MD;  Location: [facility];  Service: Endoscopy;  Laterality: N/A;   LIPOMA EXCISION     neck    Prior to Admission medications   Medication Sig Start Date End Date Taking? Authorizing Provider  furosemide (LASIX) 40 MG tablet Take 1 tablet (40 mg total) by mouth daily. 12/12/20 12/12/21 Yes Lucilla Lame, MD  metFORMIN (GLUCOPHAGE) 850 MG tablet Take 850 mg by mouth 2 (two) times daily with a meal.   Yes [provider]  spironolactone (ALDACTONE) 100 MG tablet Take 1 tablet (100 mg total) by mouth daily. 12/12/20 12/12/21 Yes Lucilla Lame, MD  aspirin EC 81 MG tablet Take 1 tablet (81 mg total) by mouth daily. Patient not taking: Reported on 12/09/2020 05/04/16   Kathrine Haddock, NP  blood glucose meter kit and supplies Dispense based on patient and insurance preference. Use up to four times daily as directed. (FOR ICD-9 250.00, 250.01). Patient not taking: Reported on 12/09/2020 05/04/16   Kathrine Haddock, NP  lisinopril (PRINIVIL,ZESTRIL) 10 MG tablet Take 1 tablet (10 mg total) by mouth daily. Patient not taking: Reported on 01/23/2021  05/21/17   Kathrine Haddock, NP  naproxen (NAPROSYN) 500 MG tablet Take 1 tablet (500 mg total) by mouth 2 (two) times daily with a meal. Patient not taking: Reported on 12/09/2020 03/30/17   Johnn Hai, PA-C  Sod Picosulfate-Mag Ox-Cit Acd (CLENPIQ) 10-3.5-12 MG-GM -GM/160ML SOLN Take 320 mLs by mouth as directed. 01/23/21   Lucilla Lame, MD    Allergies as of 01/22/2021   (No Known Allergies)    Family History  Problem Relation Age of Onset   Diabetes Father    Hypertension Father     Social History   Socioeconomic History   Marital status: Married    Spouse name: Not on file   Number of children: Not on file   Years of education: Not on file   Highest education level: Not on file  Occupational History   Not on file  Tobacco Use   Smoking status: Former    Packs/day: 0.10    Years: 40.00    Pack years: 4.00    Types: Cigarettes    Quit date: 10/03/2020    Years since quitting: 0.3   Smokeless tobacco: Former   Tobacco comments:    Started smoking 1-2 cigs/day around age 41.  Quit around 10/03/20  Vaping Use   Vaping Use: Never used  Substance and Sexual Activity   Alcohol use: Not Currently    Alcohol/week: 42.0 standard drinks    Types: 42 Cans of beer per week    Comment: Quit drinking around 10/03/20  Drug use: No   Sexual activity: Never  Other Topics Concern   Not on file  Social History Narrative   Not on file   Social Determinants of Health   Financial Resource Strain: Not on file  Food Insecurity: Not on file  Transportation Needs: Not on file  Physical Activity: Not on file  Stress: Not on file  Social Connections: Not on file  Intimate Partner Violence: Not on file    Review of Systems: See HPI, otherwise negative ROS  Physical Exam: BP (!) 139/93    Pulse 91    Temp 98.2 F (36.8 C) (Temporal)    Resp (!) 90    Ht 5' 6"  (1.676 m)    Wt 105.3 kg    SpO2 98%    BMI 37.48 kg/m  General:   Alert,  pleasant and cooperative in NAD Head:   Normocephalic and atraumatic. Neck:  Supple; no masses or thyromegaly. Lungs:  Clear throughout to auscultation.    Heart:  Regular rate and rhythm. Abdomen:  Soft, nontender and nondistended. Normal bowel sounds, without guarding, and without rebound.   Neurologic:  Alert and  oriented x4;  grossly normal neurologically.  Impression/Plan: Jonathan Griffin is here for an endoscopy and colonoscopy to be performed for screening and cirrhosis  Risks, benefits, limitations, and alternatives regarding  endoscopy and colonoscopy have been reviewed with the patient.  Questions have been answered.  All parties agreeable.   Lucilla Lame, MD  02/06/2021, 9:15 AM

## 2021-02-06 NOTE — Op Note (Signed)
[facility] Patient Name: Jonathan AliJose Cobbins Procedure Date: 02/06/2021 9:45 AM MRN: [id number] Account #: [id number] Date of Birth: May 21, 1965 Admit Type: Outpatient Age: [age] Room[facility]OM 01 Gender: Male Note Status: Finalized Instrument Name: [id number] Procedure:             Upper GI endoscopy Indications:           Esophageal varices Providers:             Midge Miniumarren Charice Zuno MD, MD Medicines:             Propofol per Anesthesia Complications:         No immediate complications. Procedure:             Pre-Anesthesia Assessment:                        - Prior to the procedure, a History and Physical was                         performed, and patient medications and allergies were                         reviewed. The patient's tolerance of previous                         anesthesia was also reviewed. The risks and benefits                         of the procedure and the sedation options and risks                         were discussed with the patient. All questions were                         answered, and informed consent was obtained. Prior                         Anticoagulants: The patient has taken no previous                         anticoagulant or antiplatelet agents. ASA Grade                         Assessment: II - A patient with mild systemic disease.                         After reviewing the risks and benefits, the patient                         was deemed in satisfactory condition to undergo the                         procedure.                        After obtaining informed consent, the endoscope was                         passed under direct vision. Throughout the procedure,  the patient's blood pressure, pulse, and oxygen                         saturations were monitored continuously. The Endoscope                         was introduced through the mouth, and advanced to the                          second part of duodenum. The upper GI endoscopy was                         accomplished without difficulty. The patient tolerated                         the procedure well. Findings:      Grade II varices were found in the lower third of the esophagus. Three       bands were successfully placed with complete eradication, resulting in       deflation of varices. There was no bleeding during the procedure.      Moderate portal hypertensive gastropathy was found in the entire       examined stomach.      The examined duodenum was normal. Impression:            - Grade II esophageal varices. Completely eradicated.                         Banded.                        - Portal hypertensive gastropathy.                        - Normal examined duodenum.                        - No specimens collected. Recommendation:        - Discharge patient to home.                        - Resume previous diet.                        - Perform a colonoscopy today.                        - Repeat upper endoscopy in 2 years for surveillance. Procedure Code(s):     --- Professional ---                        [phone], Esophagogastroduodenoscopy, flexible,                         transoral; with band ligation of esophageal/gastric                         varices Diagnosis Code(s):     --- Professional ---                        I85.00, Esophageal varices without bleeding  K76.6, Portal hypertension                        K31.89, Other diseases of stomach and duodenum CPT copyright 2019 American Medical Association. All rights reserved. The codes documented in this report are preliminary and upon coder review may  be revised to meet current compliance requirements. Midge Minium MD, MD 02/06/2021 10:02:25 AM This report has been signed electronically. Number of Addenda: 0 Note Initiated On: 02/06/2021 9:45 AM Total Procedure Duration: 0 hours 6 minutes 48 seconds  Estimated Blood Loss:   Estimated blood loss: none.      [facility]

## 2021-02-07 ENCOUNTER — Encounter: Payer: Self-pay | Admitting: Gastroenterology

## 2022-01-29 ENCOUNTER — Observation Stay
Admission: EM | Admit: 2022-01-29 | Discharge: 2022-01-30 | Disposition: A | Discharge status: Home / Self Care | Payer: BC Managed Care – PPO | Attending: Internal Medicine | Admitting: Internal Medicine

## 2022-01-29 ENCOUNTER — Other Ambulatory Visit: Payer: Self-pay

## 2022-01-29 ENCOUNTER — Encounter: Payer: Self-pay | Admitting: Emergency Medicine

## 2022-01-29 DIAGNOSIS — G4730 Sleep apnea, unspecified: Secondary | ICD-10-CM | POA: Diagnosis present

## 2022-01-29 DIAGNOSIS — E119 Type 2 diabetes mellitus without complications: Secondary | ICD-10-CM | POA: Diagnosis not present

## 2022-01-29 DIAGNOSIS — Z7982 Long term (current) use of aspirin: Secondary | ICD-10-CM | POA: Diagnosis not present

## 2022-01-29 DIAGNOSIS — K7682 Hepatic encephalopathy: Secondary | ICD-10-CM | POA: Diagnosis not present

## 2022-01-29 DIAGNOSIS — Z1152 Encounter for screening for COVID-19: Secondary | ICD-10-CM | POA: Insufficient documentation

## 2022-01-29 DIAGNOSIS — R2681 Unsteadiness on feet: Secondary | ICD-10-CM

## 2022-01-29 DIAGNOSIS — K7031 Alcoholic cirrhosis of liver with ascites: Secondary | ICD-10-CM | POA: Diagnosis not present

## 2022-01-29 DIAGNOSIS — Z87891 Personal history of nicotine dependence: Secondary | ICD-10-CM | POA: Insufficient documentation

## 2022-01-29 DIAGNOSIS — E785 Hyperlipidemia, unspecified: Secondary | ICD-10-CM | POA: Diagnosis present

## 2022-01-29 DIAGNOSIS — Z7984 Long term (current) use of oral hypoglycemic drugs: Secondary | ICD-10-CM | POA: Diagnosis not present

## 2022-01-29 DIAGNOSIS — R2689 Other abnormalities of gait and mobility: Secondary | ICD-10-CM | POA: Insufficient documentation

## 2022-01-29 DIAGNOSIS — R410 Disorientation, unspecified: Secondary | ICD-10-CM | POA: Diagnosis present

## 2022-01-29 DIAGNOSIS — I10 Essential (primary) hypertension: Secondary | ICD-10-CM | POA: Diagnosis not present

## 2022-01-29 DIAGNOSIS — Z79899 Other long term (current) drug therapy: Secondary | ICD-10-CM | POA: Diagnosis not present

## 2022-01-29 LAB — COMPREHENSIVE METABOLIC PANEL
ALT: 26 U/L (ref 0–44)
AST: 31 U/L (ref 15–41)
Albumin: 3.8 g/dL (ref 3.5–5.0)
Alkaline Phosphatase: 169 U/L — ABNORMAL HIGH (ref 38–126)
Anion gap: 11 (ref 5–15)
BUN: 17 mg/dL (ref 6–20)
CO2: 20 mmol/L — ABNORMAL LOW (ref 22–32)
Calcium: 9.8 mg/dL (ref 8.9–10.3)
Chloride: 104 mmol/L (ref 98–111)
Creatinine, Ser: 0.94 mg/dL (ref 0.61–1.24)
GFR, Estimated: >60 mL/min (ref >60)
Glucose, Bld: 303 mg/dL — ABNORMAL HIGH (ref 70–99)
Potassium: 4.1 mmol/L (ref 3.5–5.1)
Sodium: 135 mmol/L (ref 135–145)
Total Bilirubin: 1.6 mg/dL — ABNORMAL HIGH (ref 0.3–1.2)
Total Protein: 8 g/dL (ref 6.5–8.1)

## 2022-01-29 LAB — URINALYSIS, COMPLETE (UACMP) WITH MICROSCOPIC
Bacteria, UA: NONE SEEN
Bilirubin Urine: NEGATIVE
Glucose, UA: >=500 mg/dL — AB
Hgb urine dipstick: NEGATIVE
Ketones, ur: NEGATIVE mg/dL
Leukocytes,Ua: NEGATIVE
Nitrite: NEGATIVE
Protein, ur: NEGATIVE mg/dL
Specific Gravity, Urine: 1.01 (ref 1.005–1.030)
WBC, UA: NONE SEEN WBC/hpf (ref 0–5)
pH: 5 (ref 5.0–8.0)

## 2022-01-29 LAB — CBC
HCT: 43.3 % (ref 39.0–52.0)
Hemoglobin: 14.4 g/dL (ref 13.0–17.0)
MCH: 28.3 pg (ref 26.0–34.0)
MCHC: 33.3 g/dL (ref 30.0–36.0)
MCV: 85.1 fL (ref 80.0–100.0)
Platelets: 202 10*3/uL (ref 150–400)
RBC: 5.09 MIL/uL (ref 4.22–5.81)
RDW: 15.5 % (ref 11.5–15.5)
WBC: 4.5 10*3/uL (ref 4.0–10.5)
nRBC: 0 % (ref 0.0–0.2)

## 2022-01-29 LAB — RESP PANEL BY RT-PCR (RSV, FLU A&B, COVID)  RVPGX2
Influenza A by PCR: NEGATIVE
Influenza B by PCR: NEGATIVE
Resp Syncytial Virus by PCR: NEGATIVE
SARS Coronavirus 2 by RT PCR: NEGATIVE

## 2022-01-29 LAB — CBG MONITORING, ED: Glucose-Capillary: 339 mg/dL — ABNORMAL HIGH (ref 70–99)

## 2022-01-29 LAB — AMMONIA: Ammonia: 89 umol/L — ABNORMAL HIGH (ref 9–35)

## 2022-01-29 MED ORDER — SALINE SPRAY 0.65 % NA SOLN: 1.0000 | NASAL | Status: DC | PRN

## 2022-01-29 MED ORDER — ONDANSETRON HCL 4 MG PO TABS: 4.0000 mg | ORAL_TABLET | Freq: Four times a day (QID) | ORAL | Status: DC | PRN

## 2022-01-29 MED ORDER — ACETAMINOPHEN 325 MG RE SUPP: 650.0000 mg | Freq: Four times a day (QID) | RECTAL | Status: DC | PRN

## 2022-01-29 MED ORDER — ACETAMINOPHEN 325 MG PO TABS: 650.0000 mg | ORAL_TABLET | Freq: Four times a day (QID) | ORAL | Status: DC | PRN

## 2022-01-29 MED ORDER — ENOXAPARIN SODIUM 60 MG/0.6ML IJ SOSY
0.5000 mg/kg | PREFILLED_SYRINGE | INTRAMUSCULAR | Status: DC
  Administered 2022-01-29: 52.5 mg via SUBCUTANEOUS
  Filled 2022-01-29: qty 0.6

## 2022-01-29 MED ORDER — POLYETHYLENE GLYCOL 3350 17 G PO PACK: 17.0000 g | PACK | Freq: Every day | ORAL | Status: DC | PRN

## 2022-01-29 MED ORDER — INSULIN ASPART 100 UNIT/ML IJ SOLN
0.0000 [IU] | Freq: Every day | INTRAMUSCULAR | Status: DC
  Administered 2022-01-29: 4 [IU] via SUBCUTANEOUS
  Filled 2022-01-29: qty 1

## 2022-01-29 MED ORDER — ONDANSETRON HCL 4 MG/2ML IJ SOLN: 4.0000 mg | Freq: Four times a day (QID) | INTRAMUSCULAR | Status: DC | PRN

## 2022-01-29 MED ORDER — LACTULOSE 10 GM/15ML PO SOLN
30.0000 g | Freq: Once | ORAL | Status: AC
  Administered 2022-01-29: 30 g via ORAL

## 2022-01-29 MED ORDER — LACTULOSE 10 GM/15ML PO SOLN
30.0000 g | Freq: Two times a day (BID) | ORAL | Status: DC
  Administered 2022-01-30: 30 g via ORAL
  Filled 2022-01-29: qty 60

## 2022-01-29 MED ORDER — SODIUM CHLORIDE 0.9 % IV BOLUS
1000.0000 mL | Freq: Once | INTRAVENOUS | Status: AC
  Administered 2022-01-29: 1000 mL via INTRAVENOUS

## 2022-01-29 MED ORDER — INSULIN ASPART 100 UNIT/ML IJ SOLN: 0.0000 [IU] | Freq: Three times a day (TID) | INTRAMUSCULAR | Status: DC

## 2022-01-29 NOTE — Assessment & Plan Note (Signed)
Suspect this is secondary to his hepatic encephalopathy.  PT to see.

## 2022-01-29 NOTE — ED Triage Notes (Signed)
Pt reports since Monday he has had some confusion. Pt reports he didn't know where he was at. Pt reports this has happened before on the 5th of this month. Pt also having difficulty sleeping and having tremors.

## 2022-01-29 NOTE — ED Provider Notes (Signed)
[facility] Provider Note    Event Date/Time   First MD Initiated Contact with Patient 01/29/22 1508     (approximate)  History   Chief Complaint: Altered Mental Status (/)  HPI  Jonathan Griffin is a 56 y.o. male with a past medical history of cirrhosis, diabetes, hypertension, hyperlipidemia presents to the emergency department for worsening confusion.  According to the patient and wife, Spanish interpreter used for this evaluation, they have noticed increased confusion.  This week the patient states he tried to go to the bathroom and walked into a different room in his house, did not know where he was.  Wife states he was at work recently and cannot remember where he was or why he was there.  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  Does not take lactulose at home.  Physical Exam   Triage Vital Signs: ED Triage Vitals  Enc Vitals Group     BP 01/29/22 1251 127/84     Pulse Rate 01/29/22 1251 87     Resp 01/29/22 1251 18     Temp 01/29/22 1251 98.4 F (36.9 C)     Temp Source 01/29/22 1251 Oral     SpO2 01/29/22 1251 96 %     Weight 01/29/22 1050 233 lb 11 oz (106 kg)     Height 01/29/22 1050 5\' 6"  (1.676 m)     Head Circumference --      Peak Flow --      Pain Score 01/29/22 1049 0     Pain Loc --      Pain Edu? --      Excl. in GC? --     Most recent vital signs: Vitals:   01/29/22 1251  BP: 127/84  Pulse: 87  Resp: 18  Temp: 98.4 F (36.9 C)  SpO2: 96%    General: Awake, no distress.  CV:  Good peripheral perfusion.  Regular rate and rhythm  Resp:  Normal effort.  Equal breath sounds bilaterally.  Abd:  No distention.  Soft, nontender.  No rebound or guarding.   ED Results / Procedures / Treatments   EKG  EKG viewed and interpreted by myself shows a normal sinus rhythm at 93 bpm with a narrow QRS, normal axis, normal intervals, slight T wave inversions in the inferior leads.  No ST  elevation.  MEDICATIONS ORDERED IN ED: Medications  lactulose (CHRONULAC) 10 GM/15ML solution 30 g (30 g Oral Given 01/29/22 1547)     IMPRESSION / MDM / ASSESSMENT AND PLAN / ED COURSE  I reviewed the triage vital signs and the nursing notes.  Patient's presentation is most consistent with acute presentation with potential threat to life or bodily function.  Patient presents emergency department for intermittent but worsening confusion.  History of cirrhosis, no alcohol x 1 year.  Currently the patient is awake he is alert wife states worsening confusion especially over this past week.  Patient does not take lactulose at home.  Lab work performed today shows reassuring chemistry, mild LFT elevation of the alkaline phosphatase and bilirubin largely unchanged from prior values.  CBC is reassuring with a normal white blood cell count.  Patient's glucose is somewhat elevated at 303 we will start the patient on IV fluids.  Patient's ammonia is elevated consistent with hyperammonemia likely causing hepatic encephalopathy.  We will dose 30 mg of oral lactulose, IV fluids.  Given the patient's worsening confusion we will admit for  hepatic encephalopathy for further workup and treatment and to ensure the ammonia level declines patient will likely require lactulose at home going forward.  FINAL CLINICAL IMPRESSION(S) / ED DIAGNOSES   Hepatic encephalopathy    Note:  This document was prepared using Dragon voice recognition software and may include unintentional dictation errors.   Minna Antis, MD 01/29/22 1555

## 2022-01-29 NOTE — Assessment & Plan Note (Signed)
Meets criteria for BMI greater than 35 and comorbidities of diabetes and hypertension.

## 2022-01-29 NOTE — Hospital Course (Signed)
Patient is a 56 year old male with past medical history of morbid obesity, alcoholic cirrhosis (sober x 1 year), diabetes and hypertension who presented to the emergency room after it was noted that he was having increased confusion over this past week (such as not knowing where patient was and when trying to go to the bathroom, was walking to a different room of the house or when he went to work could not remember where he was or why he was there.  In the emergency room, lab work noteworthy for glucose of 303 and total bili of 1.6, and ammonia level of 89.  (Patient normally not on lactulose.)  Patient given dose of lactulose and hospitalist asked to bring in for further evaluation.

## 2022-01-29 NOTE — Assessment & Plan Note (Addendum)
Blood pressure stable.  Will continue home meds accordingly (although looks like patient has not been taking)

## 2022-01-29 NOTE — H&P (Signed)
History and Physical    Patient: Jonathan Griffin MRN:[id number] DOB: 12/09/1965 DOA: 01/29/2022 DOS: the patient was seen and examined on 01/29/2022 PCP: [facility]  Patient coming from: Home  Chief Complaint:  Chief Complaint  Patient presents with   Altered Mental Status       History obtained from patient and his wife through translator HPI: Patient is a 56-year-old male with past medical history of morbid obesity, alcoholic cirrhosis (sober x 1 year), diabetes and hypertension who presented to the emergency room after it was noted that he was having increased confusion over this past week (such as not knowing where patient was and when trying to go to the bathroom, was walking to a different room of the house or when he went to work could not remember where he was or why he was there.  In the emergency room, lab work noteworthy for glucose of 303 and total bili of 1.6, and ammonia level of 89.  (Patient normally not on lactulose.)  Patient given dose of lactulose and hospitalist asked to bring in for further evaluation.  Review of Systems: Other than described above, patient's only complaint is some difficulty walking intermittently over the past few days.  His wife thinks it could be related to his confusion.  He also complains of some nasal congestion. Past Medical History:  Diagnosis Date   Cirrhosis of liver with ascites (HCC)    Diabetes mellitus without complication (HCC)    Hyperlipemia    Hypertension    Past Surgical History:  Procedure Laterality Date   BACK SURGERY     COLONOSCOPY WITH PROPOFOL N/A 02/06/2021   Procedure: COLONOSCOPY WITH PROPOFOL;  Surgeon: Wohl, Darren, MD;  Location: [facility];  Service: Endoscopy;  Laterality: N/A;  Diabetic   ESOPHAGEAL BANDING  12/12/2020   Procedure: ESOPHAGEAL BANDING x 3 bands;  Surgeon: Wohl, Darren, MD;  Location: [facility];  Service: Endoscopy;;   ESOPHAGEAL BANDING  02/06/2021   Procedure:  ESOPHAGEAL BANDING;  Surgeon: Wohl, Darren, MD;  Location: [facility];  Service: Endoscopy;;   ESOPHAGOGASTRODUODENOSCOPY N/A 02/06/2021   Procedure: ESOPHAGOGASTRODUODENOSCOPY (EGD);  Surgeon: Wohl, Darren, MD;  Location: [facility];  Service: Endoscopy;  Laterality: N/A;   ESOPHAGOGASTRODUODENOSCOPY (EGD) WITH PROPOFOL N/A 12/12/2020   Procedure: ESOPHAGOGASTRODUODENOSCOPY (EGD) WITH PROPOFOL;  Surgeon: Wohl, Darren, MD;  Location: [facility];  Service: Endoscopy;  Laterality: N/A;   LIPOMA EXCISION     neck   Social History:  reports that he quit smoking about 15 months ago. His smoking use included cigarettes. He has a 4.00 pack-year smoking history. He has quit using smokeless tobacco. He reports that he does not currently use alcohol after a past usage of about 42.0 standard drinks of alcohol per week. He reports that he does not use drugs.  No Known Allergies  Family History  Problem Relation Age of Onset   Diabetes Father    Hypertension Father     Prior to Admission medications   Medication Sig Start Date End Date Taking? Authorizing Provider  furosemide (LASIX) 40 MG tablet Take 1 tablet (40 mg total) by mouth daily. 12/12/20 01/29/22 Yes Wohl, Darren, MD  metFORMIN (GLUCOPHAGE) 850 MG tablet Take 850 mg by mouth 2 (two) times daily with a meal.   Yes [provider]  spironolactone (ALDACTONE) 100 MG tablet Take 1 tablet (100 mg total) by mouth daily. 12/12/20 01/29/22 Yes Wohl, Darren, MD  aspirin EC 81 MG tablet Take   1 tablet (81 mg total) by mouth daily. Patient not taking: Reported on 12/09/2020 05/04/16   Kathrine Haddock, NP  blood glucose meter kit and supplies Dispense based on patient and insurance preference. Use up to four times daily as directed. (FOR ICD-9 250.00, 250.01). Patient not taking: Reported on 12/09/2020 05/04/16   Kathrine Haddock, NP  lisinopril (PRINIVIL,ZESTRIL) 10 MG tablet Take 1 tablet (10 mg total) by mouth daily. Patient  not taking: Reported on 01/23/2021 05/21/17   Kathrine Haddock, NP  naproxen (NAPROSYN) 500 MG tablet Take 1 tablet (500 mg total) by mouth 2 (two) times daily with a meal. Patient not taking: Reported on 12/09/2020 03/30/17   Johnn Hai, PA-C  Sod Picosulfate-Mag Ox-Cit Acd (CLENPIQ) 10-3.5-12 MG-GM -GM/160ML SOLN Take 320 mLs by mouth as directed. Patient not taking: Reported on 01/29/2022 01/23/21   Lucilla Lame, MD    Physical Exam: Vitals:   01/29/22 1050 01/29/22 1251 01/29/22 1807  BP:  127/84   Pulse:  87   Resp:  18   Temp:  98.4 F (36.9 C) 98.8 F (37.1 C)  TempSrc:  Oral Oral  SpO2:  96%   Weight: 106 kg    Height: 5' 6" (1.676 m)     General: Alert and oriented x 3, no acute distress (patient and wife clarified that currently he is not having any confusion) HEENT: Normocephalic atraumatic, mucous membranes are moist Cardiovascular: Regular rate and rhythm, S1-S2 Lungs: Clear to auscultation bilaterally Abdomen: Soft, nontender, nondistended, positive bowel sounds Extremities: No clubbing cyanosis or edema Neuro: No focal deficits:  Psychiatry: Patient is appropriate, no evidence of psychoses  Skin: No skin breaks, tears or lesions   Data Reviewed:  Lab work noteworthy for glucose of 303, ammonia level of 89 and total bilirubin of 1.6.  Note that he is not thrombocytopenic. EKG notes normal sinus rhythm with prolonged QT Assessment and Plan: * Hepatic encephalopathy ([place]) Has not previously been on lactulose.  Will start.  Alcoholic cirrhosis of liver with ascites ([place]) Fortunately no evidence of thrombocytopenia.  Has previous history of portal hypertension and esophageal varices.  Hypertension Blood pressure stable.  Will continue home meds accordingly (although looks like patient has not been taking)  Type 2 diabetes mellitus without complications ([place]) Noted hyperglycemia.  Will check A1c.  Sliding scale.  Unsteady gait Suspect this is secondary to  his hepatic encephalopathy.  PT to see.  Hyperlipidemia Does not appear to be on statin.  Morbid obesity ([place]) Meets criteria for BMI greater than 35 and comorbidities of diabetes and hypertension.      Advance Care Planning:   Code Status: Full Code   Consults: None  Family Communication: Wife at the bedside  Severity of Illness: The appropriate patient status for this patient is OBSERVATION. Observation status is judged to be reasonable and necessary in order to provide the required intensity of service to ensure the patient's safety. The patient's presenting symptoms, physical exam findings, and initial radiographic and laboratory data in the context of their medical condition is felt to place them at decreased risk for further clinical deterioration. Furthermore, it is anticipated that the patient will be medically stable for discharge from the hospital within 2 midnights of admission.   Author: Annita Brod, MD 01/29/2022 9:26 PM  For on call review [email or web address].

## 2022-01-29 NOTE — Assessment & Plan Note (Signed)
Has not previously been on lactulose.  Will start.

## 2022-01-29 NOTE — Assessment & Plan Note (Signed)
Fortunately no evidence of thrombocytopenia.  Has previous history of portal hypertension and esophageal varices.

## 2022-01-29 NOTE — Progress Notes (Signed)
PHARMACIST - PHYSICIAN COMMUNICATION  CONCERNING:  Enoxaparin (Lovenox) for DVT Prophylaxis    RECOMMENDATION: Patient was prescribed enoxaparin 40mg  q24 hours for VTE prophylaxis.   Filed Weights   01/29/22 1050  Weight: 106 kg (233 lb 11 oz)    Body mass index is 37.72 kg/m.  Estimated Creatinine Clearance: 100.2 mL/min (by C-G formula based on SCr of 0.94 mg/dL).   Based on [facility] policy patient is candidate for enoxaparin 0.5mg /kg TBW SQ every 24 hours based on BMI being >30.  DESCRIPTION: Pharmacy has adjusted enoxaparin dose per [facility] policy.  Patient is now receiving enoxaparin 52.5 mg every 24 hours    [facility] 01/29/2022 7:52 PM

## 2022-01-29 NOTE — Assessment & Plan Note (Signed)
Noted hyperglycemia.  Will check A1c.  Sliding scale.

## 2022-01-29 NOTE — Assessment & Plan Note (Signed)
Does not appear to be on statin.

## 2022-01-30 LAB — COMPREHENSIVE METABOLIC PANEL
ALT: 25 U/L (ref 0–44)
AST: 45 U/L — ABNORMAL HIGH (ref 15–41)
Albumin: 2.9 g/dL — ABNORMAL LOW (ref 3.5–5.0)
Alkaline Phosphatase: 151 U/L — ABNORMAL HIGH (ref 38–126)
Anion gap: 12 (ref 5–15)
BUN: 16 mg/dL (ref 6–20)
CO2: 16 mmol/L — ABNORMAL LOW (ref 22–32)
Calcium: 8.9 mg/dL (ref 8.9–10.3)
Chloride: 108 mmol/L (ref 98–111)
Creatinine, Ser: 0.99 mg/dL (ref 0.61–1.24)
GFR, Estimated: >60 mL/min (ref >60)
Glucose, Bld: 269 mg/dL — ABNORMAL HIGH (ref 70–99)
Potassium: 4.3 mmol/L (ref 3.5–5.1)
Sodium: 136 mmol/L (ref 135–145)
Total Bilirubin: 0.8 mg/dL (ref 0.3–1.2)
Total Protein: 6.1 g/dL — ABNORMAL LOW (ref 6.5–8.1)

## 2022-01-30 LAB — HIV ANTIBODY (ROUTINE TESTING W REFLEX): HIV Screen 4th Generation wRfx: NONREACTIVE

## 2022-01-30 LAB — AMMONIA: Ammonia: 92 umol/L — ABNORMAL HIGH (ref 9–35)

## 2022-01-30 MED ORDER — FUROSEMIDE 40 MG PO TABS
40.0000 mg | ORAL_TABLET | Freq: Every day | ORAL | Status: DC
  Administered 2022-01-30: 40 mg via ORAL
  Filled 2022-01-30: qty 1

## 2022-01-30 MED ORDER — SPIRONOLACTONE 25 MG PO TABS
100.0000 mg | ORAL_TABLET | Freq: Every day | ORAL | Status: DC
  Administered 2022-01-30: 100 mg via ORAL
  Filled 2022-01-30: qty 4

## 2022-01-30 MED ORDER — SPIRONOLACTONE 100 MG PO TABS: 100.0000 mg | ORAL_TABLET | Freq: Every day | ORAL | 2 refills | Status: AC

## 2022-01-30 MED ORDER — METFORMIN HCL 850 MG PO TABS
850.0000 mg | ORAL_TABLET | Freq: Two times a day (BID) | ORAL | Status: DC
  Administered 2022-01-30: 850 mg via ORAL
  Filled 2022-01-30 (×2): qty 1

## 2022-01-30 MED ORDER — FUROSEMIDE 40 MG PO TABS: 40.0000 mg | ORAL_TABLET | Freq: Every day | ORAL | 2 refills | Status: AC

## 2022-01-30 MED ORDER — LACTULOSE 10 GM/15ML PO SOLN: 30.0000 g | Freq: Two times a day (BID) | ORAL | 2 refills | Status: DC

## 2022-01-30 NOTE — ED Notes (Signed)
UP to bathroom, steady gate. Positive results from lactulose. Pt with no needs.

## 2022-01-30 NOTE — Progress Notes (Signed)
PT Cancellation Note  Patient Details Name: Jonathan Griffin MRN: [id number] DOB: 1966-01-24   Cancelled Treatment:    Reason Eval/Treat Not Completed: PT screened, no needs identified, will sign off (Consult received and chart reviewed.  Per notes, patient ambulating indep with mobility specialist; steady with no safety/mobility concerns identified.  Will complete PT order; please reconsult should needs change.)   Joel Cowin H. Manson Passey, PT, DPT, NCS 01/30/22, 12:13 PM [phone]

## 2022-01-30 NOTE — TOC Initial Note (Signed)
Transition of Care [facility]) - Initial/Assessment Note    Patient Details  Name: Jonathan Griffin MRN: [id number] Date of Birth: 06-Feb-1965  Transition of Care [facility]) CM/SW Contact:    Allayne Butcher, RN Phone Number: 01/30/2022, 12:36 PM  Clinical Narrative:                   Transition of Care [facility]) Screening Note   Patient Details  Name: Jonathan Griffin Date of Birth: 1965-09-30   Transition of Care [facility]) CM/SW Contact:    Allayne Butcher, RN Phone Number: 01/30/2022, 12:36 PM    Transition of Care Department [facility]) has reviewed patient and no TOC needs have been identified at this time. We will continue to monitor patient advancement through interdisciplinary progression rounds. If new patient transition needs arise, please place a TOC consult.          Patient Goals and CMS Choice            Expected Discharge Plan and Services         Expected Discharge Date: 01/30/22                                    Prior Living Arrangements/Services                       Activities of Daily Living      Permission Sought/Granted                  Emotional Assessment              Admission diagnosis:  Hepatic encephalopathy (HCC) [K76.82] Patient Active Problem List   Diagnosis Date Noted   Hepatic encephalopathy (HCC) 01/29/2022   Morbid obesity (HCC) 01/29/2022   Unsteady gait 01/29/2022   Special screening for malignant neoplasms, colon    Portal hypertension (HCC)    Esophageal varices without bleeding (HCC)    Alcoholic cirrhosis of liver with ascites (HCC) 11/02/2020   Anemia 11/02/2020   Sleep apnea 04/09/2015   Extreme obesity 04/09/2015   Hyperlipidemia 04/04/2015   Type 2 diabetes mellitus without complications (HCC) 04/04/2015   Hypertension 04/04/2015   PCP:  [facility] Pharmacy:   [id number] [place], [place] - [address] [place] [address] Phone: [phone] Fax:  [phone]  [facility] [phone] [facility] Closed - [place], [place] - [address] [place] [address] Phone: [phone] Fax: [phone]     Social Determinants of Health (SDOH) Social History: SDOH Screenings   Tobacco Use: Medium Risk (01/29/2022)   SDOH Interventions:     Readmission Risk Interventions     No data to display

## 2022-01-30 NOTE — Progress Notes (Addendum)
Mobility Specialist - Progress Note    01/30/22 1100  Mobility  Activity Ambulated independently in hallway;Stood at bedside  Level of Assistance Independent  Assistive Device None  Distance Ambulated (ft) 320 ft  Activity Response Tolerated well  Mobility Referral Yes  $Mobility charge 1 Mobility    Pt resting EOB upon entry on RA. Pt STS and ambulates in hallway Indep. Pt expressed feeling no lightheadedness or dizziness. Pt ambulates with steady gait and no SOB. Pt return to bed and left with needs in reach and family present.   Johnathan Hausen Mobility Specialist 01/30/22, 11:22 AM

## 2022-01-30 NOTE — Inpatient Diabetes Management (Signed)
Inpatient Diabetes Program Recommendations  AACE/ADA: New Consensus Statement on Inpatient Glycemic Control  Target Ranges:  Prepandial:   less than 140 mg/dL      Peak postprandial:   less than 180 mg/dL (1-2 hours)      Critically ill patients:  140 - 180 mg/dL    Latest Reference Range & Units 01/29/22 23:12  Glucose-Capillary 70 - 99 mg/dL 219 (H)  Novolog 4 units @ 23:15    Latest Reference Range & Units 01/29/22 10:52  CO2 22 - 32 mmol/L 20 (L)  Glucose 70 - 99 mg/dL 758 (H)  BUN 6 - 20 mg/dL 17  Creatinine 8.32 - 5.49 mg/dL 8.26  Calcium 8.9 - 41.5 mg/dL 9.8  Anion gap 5 - 15  11   Review of Glycemic Control  Diabetes history: DM2 Outpatient Diabetes medications: Metformin 850 mg BID Current orders for Inpatient glycemic control: Novolog 0-9 units TID with meals, Novolog 0-5 units QHS, Metformin 850 mg BID  Inpatient Diabetes Program Recommendations:    Diet: Please add Carb Modified to 2 gram sodium diet.  HbgA1C:  A1C in process. If elevated, may need additional DM medications prescribed at discharge.   NOTE: Patient admitted with hepatic encephalopathy with initial glucose 303 mg/dl on 83/09/40. Per chart, no insurance and [facility] listed as PCP. Will place consult for TOC to assist with medication assistance if needed.   Thanks, Orlando Penner, RN, MSN, CDCES Diabetes Coordinator Inpatient Diabetes Program [phone] (Team Pager from 8am to 5pm)

## 2022-01-30 NOTE — Discharge Summary (Signed)
Physician Discharge Summary   Patient: Jonathan Griffin MRN: [id number] DOB: Oct 30, 1965  Admit date:     01/29/2022  Discharge date: 01/30/22  Discharge Physician: Fritzi Mandes   PCP: [facility]   Recommendations at discharge:    F/u PCP in 1-2 weeks Check your blood sugars at home and keep log of it  Discharge Diagnoses: Principal Problem:   Hepatic encephalopathy ([place]) Active Problems:   Alcoholic cirrhosis of liver with ascites ([place])   Hypertension   Type 2 diabetes mellitus without complications ([place])   Unsteady gait   Sleep apnea   Hyperlipidemia   Morbid obesity [facility])   Hospital Course: Patient is a 56 year old male with past medical history of morbid obesity, alcoholic cirrhosis (sober x 1 year), diabetes and hypertension who presented to the emergency room after it was noted that he was having increased confusion over this past week (such as not knowing where patient was and when trying to go to the bathroom, was walking to a different room of the house or when he went to work could not remember where he was or why he was there.  In the emergency room, lab work noteworthy for glucose of 303 and total bili of 1.6, and ammonia level of 89.  (Patient normally not on lactulose.)  Patient given dose of lactulose and hospitalist asked to bring in for further evaluation.  Assessment and Plan:  Hepatic encephalopathy ([place]) Has not previously been on lactulose.  Pt's mentation is back to baseline (confirmed with wife) Recommended to cont lactulose as out pt Ammonia 92   Alcoholic cirrhosis of liver with ascites ([place])   Has previous history of portal hypertension and esophageal varices. Cont lasix and spirnolactone   Hypertension Blood pressure stable.   Does not take bp meds  Type 2 diabetes mellitus without complications (HCC) Resumed metformin   Unsteady gait Suspect this is secondary to his hepatic encephalopathy.   Ambulated well with MT    Hyperlipidemia Does not appear to be on statin.   Morbid obesity ([place]) Meets criteria for BMI greater than 35 and comorbidities of diabetes and hypertension.  Overall doing well. Pt eager to go home Wife agreeable Discussed via spanish video interpreter      Disposition: Home Diet recommendation:  Discharge Diet Orders (From admission, onward)     Start     Ordered   01/30/22 0000  Diet - low sodium heart healthy        01/30/22 1212           Cardiac and Carb modified diet DISCHARGE MEDICATION: Allergies as of 01/30/2022   No Known Allergies      Medication List     STOP taking these medications    aspirin EC 81 MG tablet   lisinopril 10 MG tablet Commonly known as: ZESTRIL       TAKE these medications    blood glucose meter kit and supplies Dispense based on patient and insurance preference. Use up to four times daily as directed. (FOR ICD-9 250.00, 250.01).   furosemide 40 MG tablet Commonly known as: Lasix Take 1 tablet (40 mg total) by mouth daily.   lactulose 10 GM/15ML solution Commonly known as: CHRONULAC Take 45 mLs (30 g total) by mouth 2 (two) times daily.   metFORMIN 850 MG tablet Commonly known as: GLUCOPHAGE Take 850 mg by mouth 2 (two) times daily with a meal.   spironolactone 100 MG tablet Commonly known as: Aldactone Take 1 tablet (100  mg total) by mouth daily.        Follow-up Information     [facility]. Schedule an appointment as soon as possible for a visit in 1 week(s).   Why: hepatic encephalopathy Contact information: [address] [place] [facility] [address] [phone]                Discharge Exam: Filed Weights   01/29/22 1050  Weight: 106 kg   Patient alert and oriented times three. Morbid obesity. Respiratory clear to auscultation no respiratory distress Rolitta rhonchi cardiovascular heart sounds normal no murmur neuro- nonfocal. Mentation stable.  Condition at discharge: fair  The  results of significant diagnostics from this hospitalization (including imaging, microbiology, ancillary and laboratory) are listed below for reference.   Imaging Studies: No results found.  Microbiology: Results for orders placed or performed during the hospital encounter of 01/29/22  Resp panel by RT-PCR (RSV, Flu A&B, Covid) Anterior Nasal Swab     Status: None   Collection Time: 01/29/22  3:45 PM   Specimen: Anterior Nasal Swab  Result Value Ref Range Status   SARS Coronavirus 2 by RT PCR NEGATIVE NEGATIVE Final    Comment: (NOTE) SARS-CoV-2 target nucleic acids are NOT DETECTED.  The SARS-CoV-2 RNA is generally detectable in upper respiratory specimens during the acute phase of infection. The lowest concentration of SARS-CoV-2 viral copies this assay can detect is 138 copies/mL. A negative result does not preclude SARS-Cov-2 infection and should not be used as the sole basis for treatment or other patient management decisions. A negative result may occur with  improper specimen collection/handling, submission of specimen other than nasopharyngeal swab, presence of viral mutation(s) within the areas targeted by this assay, and inadequate number of viral copies(<138 copies/mL). A negative result must be combined with clinical observations, patient history, and epidemiological information. The expected result is Negative.  Fact Sheet for Patients:  EntrepreneurPulse.com.au  Fact Sheet for Healthcare Providers:  IncredibleEmployment.be  This test is no t yet approved or cleared by the [place] FDA and  has been authorized for detection and/or diagnosis of SARS-CoV-2 by FDA under an Emergency Use Authorization (EUA). This EUA will remain  in effect (meaning this test can be used) for the duration of the COVID-19 declaration under Section 564(b)(1) of the Act, 21 U.S.C.section 360bbb-3(b)(1), unless the authorization is terminated  or  revoked sooner.       Influenza A by PCR NEGATIVE NEGATIVE Final   Influenza B by PCR NEGATIVE NEGATIVE Final    Comment: (NOTE) The Xpert Xpress SARS-CoV-2/FLU/RSV plus assay is intended as an aid in the diagnosis of influenza from Nasopharyngeal swab specimens and should not be used as a sole basis for treatment. Nasal washings and aspirates are unacceptable for Xpert Xpress SARS-CoV-2/FLU/RSV testing.  Fact Sheet for Patients: EntrepreneurPulse.com.au  Fact Sheet for Healthcare Providers: IncredibleEmployment.be  This test is not yet approved or cleared by the [place] FDA and has been authorized for detection and/or diagnosis of SARS-CoV-2 by FDA under an Emergency Use Authorization (EUA). This EUA will remain in effect (meaning this test can be used) for the duration of the COVID-19 declaration under Section 564(b)(1) of the Act, 21 U.S.C. section 360bbb-3(b)(1), unless the authorization is terminated or revoked.     Resp Syncytial Virus by PCR NEGATIVE NEGATIVE Final    Comment: (NOTE) Fact Sheet for Patients: EntrepreneurPulse.com.au  Fact Sheet for Healthcare Providers: IncredibleEmployment.be  This test is not yet approved or cleared by the  [place] Architectural technologist and has been authorized for detection and/or diagnosis of SARS-CoV-2 by FDA under an Print production planner (EUA). This EUA will remain in effect (meaning this test can be used) for the duration of the COVID-19 declaration under Section 564(b)(1) of the Act, 21 U.S.C. section 360bbb-3(b)(1), unless the authorization is terminated or revoked.  Performed at [facility], [address] [place], [place] [address]     Labs: CBC: Recent Labs  Lab 01/29/22 1052  WBC 4.5  HGB 14.4  HCT 43.3  MCV 85.1  PLT 847   Basic Metabolic Panel: Recent Labs  Lab 01/29/22 1052 01/30/22 0444  NA 135 136  K 4.1 4.3  CL  104 108  CO2 20* 16*  GLUCOSE 303* 269*  BUN 17 16  CREATININE 0.94 0.99  CALCIUM 9.8 8.9   Liver Function Tests: Recent Labs  Lab 01/29/22 1052 01/30/22 0444  AST 31 45*  ALT 26 25  ALKPHOS 169* 151*  BILITOT 1.6* 0.8  PROT 8.0 6.1*  ALBUMIN 3.8 2.9*   CBG: Recent Labs  Lab 01/29/22 2312  GLUCAP 339*    Discharge time spent: greater than 30 minutes.  Signed: Fritzi Mandes, MD [facility] 01/30/2022

## 2022-01-31 LAB — HEMOGLOBIN A1C
Hgb A1c MFr Bld: 13.9 % — ABNORMAL HIGH (ref 4.8–5.6)
Mean Plasma Glucose: 352 mg/dL

## 2022-02-14 ENCOUNTER — Other Ambulatory Visit: Payer: Self-pay

## 2022-02-14 ENCOUNTER — Inpatient Hospital Stay
Admission: EM | Admit: 2022-02-14 | Discharge: 2022-02-16 | DRG: 639 | Disposition: A | Discharge status: Home / Self Care | Payer: BC Managed Care – PPO | Attending: Internal Medicine | Admitting: Internal Medicine

## 2022-02-14 ENCOUNTER — Inpatient Hospital Stay: Payer: BC Managed Care – PPO

## 2022-02-14 DIAGNOSIS — Z833 Family history of diabetes mellitus: Secondary | ICD-10-CM | POA: Diagnosis not present

## 2022-02-14 DIAGNOSIS — K7682 Hepatic encephalopathy: Secondary | ICD-10-CM | POA: Diagnosis present

## 2022-02-14 DIAGNOSIS — E1010 Type 1 diabetes mellitus with ketoacidosis without coma: Secondary | ICD-10-CM | POA: Diagnosis not present

## 2022-02-14 DIAGNOSIS — Z7984 Long term (current) use of oral hypoglycemic drugs: Secondary | ICD-10-CM | POA: Diagnosis not present

## 2022-02-14 DIAGNOSIS — I10 Essential (primary) hypertension: Secondary | ICD-10-CM | POA: Diagnosis present

## 2022-02-14 DIAGNOSIS — F419 Anxiety disorder, unspecified: Secondary | ICD-10-CM | POA: Diagnosis present

## 2022-02-14 DIAGNOSIS — Z1152 Encounter for screening for COVID-19: Secondary | ICD-10-CM

## 2022-02-14 DIAGNOSIS — E119 Type 2 diabetes mellitus without complications: Secondary | ICD-10-CM

## 2022-02-14 DIAGNOSIS — R739 Hyperglycemia, unspecified: Principal | ICD-10-CM

## 2022-02-14 DIAGNOSIS — Z8249 Family history of ischemic heart disease and other diseases of the circulatory system: Secondary | ICD-10-CM

## 2022-02-14 DIAGNOSIS — K7031 Alcoholic cirrhosis of liver with ascites: Secondary | ICD-10-CM | POA: Diagnosis present

## 2022-02-14 DIAGNOSIS — Z6835 Body mass index (BMI) 35.0-35.9, adult: Secondary | ICD-10-CM | POA: Diagnosis not present

## 2022-02-14 DIAGNOSIS — E785 Hyperlipidemia, unspecified: Secondary | ICD-10-CM | POA: Diagnosis present

## 2022-02-14 DIAGNOSIS — E1110 Type 2 diabetes mellitus with ketoacidosis without coma: Secondary | ICD-10-CM | POA: Diagnosis present

## 2022-02-14 DIAGNOSIS — J320 Chronic maxillary sinusitis: Secondary | ICD-10-CM | POA: Diagnosis present

## 2022-02-14 DIAGNOSIS — Z87891 Personal history of nicotine dependence: Secondary | ICD-10-CM

## 2022-02-14 DIAGNOSIS — E669 Obesity, unspecified: Secondary | ICD-10-CM | POA: Diagnosis not present

## 2022-02-14 DIAGNOSIS — F1011 Alcohol abuse, in remission: Secondary | ICD-10-CM | POA: Diagnosis present

## 2022-02-14 DIAGNOSIS — E875 Hyperkalemia: Secondary | ICD-10-CM | POA: Diagnosis present

## 2022-02-14 LAB — CBG MONITORING, ED
Glucose-Capillary: 366 mg/dL — ABNORMAL HIGH (ref 70–99)
Glucose-Capillary: 250 mg/dL — ABNORMAL HIGH (ref 70–99)
Glucose-Capillary: 238 mg/dL — ABNORMAL HIGH (ref 70–99)
Glucose-Capillary: 186 mg/dL — ABNORMAL HIGH (ref 70–99)
Glucose-Capillary: 140 mg/dL — ABNORMAL HIGH (ref 70–99)
Glucose-Capillary: 204 mg/dL — ABNORMAL HIGH (ref 70–99)
Glucose-Capillary: 223 mg/dL — ABNORMAL HIGH (ref 70–99)
Glucose-Capillary: 205 mg/dL — ABNORMAL HIGH (ref 70–99)

## 2022-02-14 LAB — BASIC METABOLIC PANEL
Anion gap: 17 — ABNORMAL HIGH (ref 5–15)
Anion gap: 17 — ABNORMAL HIGH (ref 5–15)
Anion gap: 23 — ABNORMAL HIGH (ref 5–15)
Anion gap: 13 (ref 5–15)
BUN: 23 mg/dL — ABNORMAL HIGH (ref 6–20)
BUN: 20 mg/dL (ref 6–20)
BUN: 18 mg/dL (ref 6–20)
BUN: 16 mg/dL (ref 6–20)
CO2: 13 mmol/L — ABNORMAL LOW (ref 22–32)
CO2: 15 mmol/L — ABNORMAL LOW (ref 22–32)
CO2: 13 mmol/L — ABNORMAL LOW (ref 22–32)
CO2: 20 mmol/L — ABNORMAL LOW (ref 22–32)
Calcium: 9.5 mg/dL (ref 8.9–10.3)
Calcium: 9.5 mg/dL (ref 8.9–10.3)
Calcium: 8.7 mg/dL — ABNORMAL LOW (ref 8.9–10.3)
Calcium: 9.5 mg/dL (ref 8.9–10.3)
Chloride: 105 mmol/L (ref 98–111)
Chloride: 105 mmol/L (ref 98–111)
Chloride: 103 mmol/L (ref 98–111)
Chloride: 105 mmol/L (ref 98–111)
Creatinine, Ser: 1.08 mg/dL (ref 0.61–1.24)
Creatinine, Ser: 1 mg/dL (ref 0.61–1.24)
Creatinine, Ser: 0.92 mg/dL (ref 0.61–1.24)
Creatinine, Ser: 0.9 mg/dL (ref 0.61–1.24)
GFR, Estimated: >60 mL/min (ref >60)
GFR, Estimated: >60 mL/min (ref >60)
GFR, Estimated: >60 mL/min (ref >60)
GFR, Estimated: >60 mL/min (ref >60)
Glucose, Bld: 272 mg/dL — ABNORMAL HIGH (ref 70–99)
Glucose, Bld: 176 mg/dL — ABNORMAL HIGH (ref 70–99)
Glucose, Bld: 206 mg/dL — ABNORMAL HIGH (ref 70–99)
Glucose, Bld: 237 mg/dL — ABNORMAL HIGH (ref 70–99)
Potassium: 3.8 mmol/L (ref 3.5–5.1)
Potassium: 3.4 mmol/L — ABNORMAL LOW (ref 3.5–5.1)
Potassium: 3.7 mmol/L (ref 3.5–5.1)
Potassium: 3.7 mmol/L (ref 3.5–5.1)
Sodium: 135 mmol/L (ref 135–145)
Sodium: 137 mmol/L (ref 135–145)
Sodium: 139 mmol/L (ref 135–145)
Sodium: 138 mmol/L (ref 135–145)

## 2022-02-14 LAB — GLUCOSE, CAPILLARY
Glucose-Capillary: 200 mg/dL — ABNORMAL HIGH (ref 70–99)
Glucose-Capillary: 227 mg/dL — ABNORMAL HIGH (ref 70–99)
Glucose-Capillary: 264 mg/dL — ABNORMAL HIGH (ref 70–99)

## 2022-02-14 LAB — COMPREHENSIVE METABOLIC PANEL
ALT: 31 U/L (ref 0–44)
AST: 41 U/L (ref 15–41)
Albumin: 3.7 g/dL (ref 3.5–5.0)
Alkaline Phosphatase: 205 U/L — ABNORMAL HIGH (ref 38–126)
Anion gap: 17 — ABNORMAL HIGH (ref 5–15)
BUN: 25 mg/dL — ABNORMAL HIGH (ref 6–20)
CO2: 18 mmol/L — ABNORMAL LOW (ref 22–32)
Calcium: 9.9 mg/dL (ref 8.9–10.3)
Chloride: 99 mmol/L (ref 98–111)
Creatinine, Ser: 1.33 mg/dL — ABNORMAL HIGH (ref 0.61–1.24)
GFR, Estimated: >60 mL/min (ref >60)
Glucose, Bld: 414 mg/dL — ABNORMAL HIGH (ref 70–99)
Potassium: 5.4 mmol/L — ABNORMAL HIGH (ref 3.5–5.1)
Sodium: 134 mmol/L — ABNORMAL LOW (ref 135–145)
Total Bilirubin: 1.2 mg/dL (ref 0.3–1.2)
Total Protein: 7.8 g/dL (ref 6.5–8.1)

## 2022-02-14 LAB — BLOOD GAS, VENOUS
Acid-base deficit: 7.1 mmol/L — ABNORMAL HIGH (ref 0.0–2.0)
Bicarbonate: 16.3 mmol/L — ABNORMAL LOW (ref 20.0–28.0)
O2 Saturation: 90.1 %
Patient temperature: 37
pCO2, Ven: 27 mmHg — ABNORMAL LOW (ref 44–60)
pH, Ven: 7.39 (ref 7.25–7.43)
pO2, Ven: 64 mmHg — ABNORMAL HIGH (ref 32–45)

## 2022-02-14 LAB — CBC
HCT: 41.6 % (ref 39.0–52.0)
Hemoglobin: 13.7 g/dL (ref 13.0–17.0)
MCH: 28.5 pg (ref 26.0–34.0)
MCHC: 32.9 g/dL (ref 30.0–36.0)
MCV: 86.5 fL (ref 80.0–100.0)
Platelets: 215 10*3/uL (ref 150–400)
RBC: 4.81 MIL/uL (ref 4.22–5.81)
RDW: 15.4 % (ref 11.5–15.5)
WBC: 6 10*3/uL (ref 4.0–10.5)
nRBC: 0 % (ref 0.0–0.2)

## 2022-02-14 LAB — RESP PANEL BY RT-PCR (RSV, FLU A&B, COVID)  RVPGX2
Influenza A by PCR: NEGATIVE
Influenza B by PCR: NEGATIVE
Resp Syncytial Virus by PCR: NEGATIVE
SARS Coronavirus 2 by RT PCR: NEGATIVE

## 2022-02-14 LAB — URINALYSIS, ROUTINE W REFLEX MICROSCOPIC
Bilirubin Urine: NEGATIVE
Glucose, UA: >=500 mg/dL — AB
Hgb urine dipstick: NEGATIVE
Ketones, ur: 5 mg/dL — AB
Leukocytes,Ua: NEGATIVE
Nitrite: NEGATIVE
Protein, ur: NEGATIVE mg/dL
Specific Gravity, Urine: 1.018 (ref 1.005–1.030)
pH: 5 (ref 5.0–8.0)

## 2022-02-14 LAB — AMMONIA: Ammonia: 158 umol/L — ABNORMAL HIGH (ref 9–35)

## 2022-02-14 LAB — PROTIME-INR
INR: 1.2 (ref 0.8–1.2)
Prothrombin Time: 15.2 seconds (ref 11.4–15.2)

## 2022-02-14 LAB — APTT: aPTT: 36 seconds (ref 24–36)

## 2022-02-14 LAB — BETA-HYDROXYBUTYRIC ACID: Beta-Hydroxybutyric Acid: 1.19 mmol/L — ABNORMAL HIGH (ref 0.05–0.27)

## 2022-02-14 MED ORDER — LACTULOSE 10 GM/15ML PO SOLN
30.0000 g | Freq: Once | ORAL | Status: AC
  Administered 2022-02-14: 30 g via ORAL
  Filled 2022-02-14: qty 60

## 2022-02-14 MED ORDER — CHLORHEXIDINE GLUCONATE CLOTH 2 % EX PADS
6.0000 | MEDICATED_PAD | Freq: Every day | CUTANEOUS | Status: DC
  Administered 2022-02-14: 6 via TOPICAL

## 2022-02-14 MED ORDER — DEXTROSE 50 % IV SOLN: 0.0000 mL | INTRAVENOUS | Status: DC | PRN

## 2022-02-14 MED ORDER — POTASSIUM CHLORIDE 10 MEQ/100ML IV SOLN
10.0000 meq | INTRAVENOUS | Status: AC
  Administered 2022-02-14 (×2): 10 meq via INTRAVENOUS
  Filled 2022-02-14 (×2): qty 100

## 2022-02-14 MED ORDER — HYDRALAZINE HCL 20 MG/ML IJ SOLN: 5.0000 mg | INTRAMUSCULAR | Status: DC | PRN

## 2022-02-14 MED ORDER — SODIUM CHLORIDE 0.9 % IV BOLUS
1000.0000 mL | Freq: Once | INTRAVENOUS | Status: AC
  Administered 2022-02-14: 1000 mL via INTRAVENOUS

## 2022-02-14 MED ORDER — LACTATED RINGERS IV SOLN: INTRAVENOUS | Status: DC

## 2022-02-14 MED ORDER — POTASSIUM CHLORIDE CRYS ER 20 MEQ PO TBCR
40.0000 meq | EXTENDED_RELEASE_TABLET | Freq: Once | ORAL | Status: AC
  Administered 2022-02-14: 40 meq via ORAL
  Filled 2022-02-14: qty 2

## 2022-02-14 MED ORDER — LACTULOSE 10 GM/15ML PO SOLN
30.0000 g | Freq: Three times a day (TID) | ORAL | Status: DC
  Administered 2022-02-14 (×2): 30 g via ORAL
  Filled 2022-02-14 (×2): qty 60

## 2022-02-14 MED ORDER — ACETAMINOPHEN 325 MG PO TABS: 650.0000 mg | ORAL_TABLET | Freq: Four times a day (QID) | ORAL | Status: DC | PRN

## 2022-02-14 MED ORDER — DEXTROSE IN LACTATED RINGERS 5 % IV SOLN: INTRAVENOUS | Status: DC

## 2022-02-14 MED ORDER — LORAZEPAM 2 MG/ML IJ SOLN
1.0000 mg | Freq: Once | INTRAMUSCULAR | Status: AC
  Administered 2022-02-14: 1 mg via INTRAVENOUS
  Filled 2022-02-14: qty 1

## 2022-02-14 MED ORDER — ONDANSETRON HCL 4 MG/2ML IJ SOLN: 4.0000 mg | Freq: Three times a day (TID) | INTRAMUSCULAR | Status: DC | PRN

## 2022-02-14 MED ORDER — ENOXAPARIN SODIUM 60 MG/0.6ML IJ SOSY
0.5000 mg/kg | PREFILLED_SYRINGE | INTRAMUSCULAR | Status: DC
  Administered 2022-02-14 – 2022-02-15 (×2): 50 mg via SUBCUTANEOUS
  Filled 2022-02-14 (×3): qty 0.6

## 2022-02-14 MED ORDER — INSULIN REGULAR(HUMAN) IN NACL 100-0.9 UT/100ML-% IV SOLN: INTRAVENOUS | Status: DC

## 2022-02-14 MED ORDER — INSULIN REGULAR(HUMAN) IN NACL 100-0.9 UT/100ML-% IV SOLN
INTRAVENOUS | Status: DC
  Administered 2022-02-14: 5.5 [IU]/h via INTRAVENOUS
  Administered 2022-02-14: 15 [IU]/h via INTRAVENOUS
  Filled 2022-02-14 (×2): qty 100

## 2022-02-14 NOTE — ED Triage Notes (Signed)
Pt has been feeling increasingly confused since last night- pt was admitted recently for hepatic encephalopathy- pt having a hard time answering questions and not following commands

## 2022-02-14 NOTE — Progress Notes (Signed)
PHARMACIST - PHYSICIAN COMMUNICATION  CONCERNING:  Enoxaparin (Lovenox) for DVT Prophylaxis    RECOMMENDATION: Patient was prescribed enoxaprin 40mg  q24 hours for VTE prophylaxis.   Filed Weights   02/14/22 0804  Weight: 101.2 kg (223 lb)    Body mass index is 35.99 kg/m.  Estimated Creatinine Clearance: 69.1 mL/min (A) (by C-G formula based on SCr of 1.33 mg/dL (H)).   Based on [place] patient is candidate for enoxaparin 0.5mg /kg TBW SQ every 24 hours based on BMI being >30.  Patient is candidate for enoxaparin 30mg  every 24 hours based on CrCl <85ml/min or Weight <45kg  DESCRIPTION: Pharmacy has adjusted enoxaparin dose per [facility] policy.  Patient is now receiving enoxaparin 50 mg every 24 hours    Darrick Penna, PharmD Clinical Pharmacist  02/14/2022 11:14 AM

## 2022-02-14 NOTE — Inpatient Diabetes Management (Addendum)
Inpatient Diabetes Program Recommendations  AACE/ADA: New Consensus Statement on Inpatient Glycemic Control (2015)  Target Ranges:  Prepandial:   less than 140 mg/dL      Peak postprandial:   less than 180 mg/dL (1-2 hours)      Critically ill patients:  140 - 180 mg/dL    Latest Reference Range & Units 01/30/22 05:12  Hemoglobin A1C 4.8 - 5.6 % 13.9 (H)  (H): Data is abnormally high  Latest Reference Range & Units 02/14/22 08:08  Sodium 135 - 145 mmol/L 134 (L)  Potassium 3.5 - 5.1 mmol/L 5.4 (H)  Chloride 98 - 111 mmol/L 99  CO2 22 - 32 mmol/L 18 (L)  Glucose 70 - 99 mg/dL 414 (H)  BUN 6 - 20 mg/dL 25 (H)  Creatinine 0.61 - 1.24 mg/dL 1.33 (H)  Calcium 8.9 - 10.3 mg/dL 9.9  Anion gap 5 - 15  17 (H)  (L): Data is abnormally low (H): Data is abnormally high  Latest Reference Range & Units 02/14/22 09:52 02/14/22 11:25 02/14/22 12:33  Glucose-Capillary 70 - 99 mg/dL 366 (H)  IV Insulin Drip Started 250 (H) 238 (H)  (H): Data is abnormally high   Admit with:  DKA Hepatic encephalopathy Alcoholic cirrhosis of liver with ascites  History: DM2, Liver Cirrhosis, Alcohol Abuse  Home DM Meds: Metformin 850 mg BID  Current Orders:IV Insulin Drip     Was just hospitalized 12/28 thru 12/29 for Hepatic encephalopathy Alcoholic cirrhosis of liver with ascites A1c at that time was 13.9%  MD- Please leave pt on the IV Insulin Drip until CBGs stable, CO2 at least 20 or higher, and Anion Gap 12 or less  When pt ready to transition to SQ Insulin, please use weight based approach to dose the basal insulin (0.15-0.2 units/kg)  Likely needs Insulin for home.  Given Hepatic issues, may be best to Stop the Metformin and Possibly d/c home on Basal insulin Daily + Sulfonylurea daily (Amaryl or Glipizide)    --Will follow patient during hospitalization--  Wyn Quaker RN, MSN, [place] Diabetes Coordinator Inpatient Glycemic Control Team Team Pager: [phone] (8a-5p)

## 2022-02-14 NOTE — Progress Notes (Signed)
Confused patient admitted from ED, wife is at bedside. Insulin gtt infusing at 5.5 ml/hr and D5LR at 125 ml/hr. Patient is spanish speaking; Interpretor called and admission assessment completed. Patient is A&O to self and place otherwise confused to year (1967) and states that "[facility]" is the president. Patient denies pain at present. Patient and wife oriented to room and call light. Patient demonstrated use of call light. Will continue to monitor.

## 2022-02-14 NOTE — ED Notes (Addendum)
Wife states pt has needed refill of lactulose and has missed a week's worth of this medication

## 2022-02-14 NOTE — ED Provider Notes (Signed)
[facility] Provider Note    Event Date/Time   First MD Initiated Contact with Patient 02/14/22 [phone]     (approximate)   History   Altered Mental Status   HPI  HPI obtained via in person Spanish interpreter.  Primary historian is the patient's wife.  EDDI HYMES is a 57 y.o. male with a history of hypertension, alcoholic liver cirrhosis, hepatic encephalopathy, type 2 diabetes, and hyperlipidemia who presents with altered mental status for the last several days, gradual onset, associated with some anxiety.  The wife states that the patient has been pacing around and has been shaky.  She states this is similar to when he was admitted recently.  She reports that he has not been on lactulose for about a week because they were out of it.  The patient has not had any vomiting, fever, difficulty breathing, or other acute symptoms.  I reviewed the past medical records.  The patient was admitted in late December with altered mental status and found to have an elevated ammonia.  He was treated with lactulose and went home the next day.   Physical Exam   Triage Vital Signs: ED Triage Vitals  Enc Vitals Group     BP 02/14/22 0803 123/84     Pulse Rate 02/14/22 0803 77     Resp 02/14/22 0803 18     Temp 02/14/22 0804 98 F (36.7 C)     Temp Source 02/14/22 0804 Oral     SpO2 02/14/22 0803 99 %     Weight 02/14/22 0804 223 lb (101.2 kg)     Height 02/14/22 0804 5\' 6"  (1.676 m)     Head Circumference --      Peak Flow --      Pain Score 02/14/22 0804 0     Pain Loc --      Pain Edu? --      Excl. in [place]? --     Most recent vital signs: Vitals:   02/14/22 0803 02/14/22 0804  BP: 123/84   Pulse: 77   Resp: 18   Temp:  98 F (36.7 C)  SpO2: 99%      General: Awake, confused, somewhat anxious appearing. CV:  Good peripheral perfusion.  Resp:  Normal effort.  Lungs CTAB. Abd:  Soft and nontender.  No distention.  Other:  Motor intact in all  extremities.  No jaundice or scleral icterus.  No peripheral edema.  Somewhat dry mucous membranes.   ED Results / Procedures / Treatments   Labs (all labs ordered are listed, but only abnormal results are displayed) Labs Reviewed  COMPREHENSIVE METABOLIC PANEL - Abnormal; Notable for the following components:      Result Value   Sodium 134 (*)    Potassium 5.4 (*)    CO2 18 (*)    Glucose, Bld 414 (*)    BUN 25 (*)    Creatinine, Ser 1.33 (*)    Alkaline Phosphatase 205 (*)    Anion gap 17 (*)    All other components within normal limits  AMMONIA - Abnormal; Notable for the following components:   Ammonia 158 (*)    All other components within normal limits  BLOOD GAS, VENOUS - Abnormal; Notable for the following components:   pCO2, Ven 27 (*)    pO2, Ven 64 (*)    Bicarbonate 16.3 (*)    Acid-base deficit 7.1 (*)    All other components within normal limits  BETA-HYDROXYBUTYRIC ACID - Abnormal; Notable for the following components:   Beta-Hydroxybutyric Acid 1.19 (*)    All other components within normal limits  CBG MONITORING, ED - Abnormal; Notable for the following components:   Glucose-Capillary 366 (*)    All other components within normal limits  RESP PANEL BY RT-PCR (RSV, FLU A&B, COVID)  [id number]  CBC  PROTIME-INR  URINALYSIS, ROUTINE W REFLEX MICROSCOPIC     EKG  ED ECG REPORT I, Arta Silence, the attending physician, personally viewed and interpreted this ECG.  Date: 02/14/2022 EKG Time: 0823 Rate: 77 Rhythm: normal sinus rhythm QRS Axis: normal Intervals: normal ST/T Wave abnormalities: normal; no peaked T waves Narrative Interpretation: no evidence of acute ischemia    RADIOLOGY    PROCEDURES:  Critical Care performed: Yes, see critical care procedure note(s)  .Critical Care  Performed by: Arta Silence, MD Authorized by: Arta Silence, MD   Critical care provider statement:    Critical care time (minutes):  30    Critical care time was exclusive of:  Separately billable procedures and treating other patients   Critical care was necessary to treat or prevent imminent or life-threatening deterioration of the following conditions:  Endocrine crisis and hepatic failure   Critical care was time spent personally by me on the following activities:  Development of treatment plan with patient or surrogate, discussions with consultants, evaluation of patient's response to treatment, examination of patient, ordering and review of laboratory studies, ordering and review of radiographic studies, ordering and performing treatments and interventions, pulse oximetry, re-evaluation of patient's condition, review of old charts and obtaining history from patient or surrogate   Care discussed with: admitting provider      MEDICATIONS ORDERED IN ED: Medications  insulin regular, human (MYXREDLIN) 100 units/ 100 mL infusion (15 Units/hr Intravenous New Bag/Given 02/14/22 1004)  lactated ringers infusion ( Intravenous New Bag/Given 02/14/22 1018)  dextrose 5 % in lactated ringers infusion (has no administration in time range)  dextrose 50 % solution 0-50 mL (has no administration in time range)  lactulose (CHRONULAC) 10 GM/15ML solution 30 g (has no administration in time range)  LORazepam (ATIVAN) injection 1 mg (1 mg Intravenous Given 02/14/22 0912)  lactulose (CHRONULAC) 10 GM/15ML solution 30 g (30 g Oral Given 02/14/22 0912)  sodium chloride 0.9 % bolus 1,000 mL (1,000 mLs Intravenous New Bag/Given 02/14/22 0908)     IMPRESSION / MDM / [place] / ED COURSE  I reviewed the triage vital signs and the nursing notes.  57 year old male with history of cirrhosis and hepatic encephalopathy presents with confusion over the last several days similar to prior episodes of hepatic cephalopathy.  He has not been taking lactulose.  On exam the patient is alert but confused and somewhat anxious appearing.  Neurologic exam is  nonfocal.  His vital signs are normal.  Differential diagnosis includes, but is not limited to, hepatic cephalopathy, dehydration, electrolyte abnormality, UTI or other infection.  We will obtain lab workup and reassess.  Patient's presentation is most consistent with acute presentation with potential threat to life or bodily function.  The patient is on the cardiac monitor to evaluate for evidence of arrhythmia and/or significant heart rate changes.  ----------------------------------------- 9:02 AM on 02/14/2022 -----------------------------------------  Initial labs showed ammonia of 158, hyperglycemia, and acidosis with an anion gap of 17.  This is concerning for DKA.  I have ordered a fluid bolus, lactulose, and will also order an insulin infusion.  ----------------------------------------- 10:52 AM on  02/14/2022 -----------------------------------------  VBG shows pH of 7.39.  The patient is on fluids and insulin.  I consulted Dr. Clyde Lundborg from the hospitalist service; based on our discussion he agrees to admit the patient.   FINAL CLINICAL IMPRESSION(S) / ED DIAGNOSES   Final diagnoses:  Hyperglycemia  Hepatic encephalopathy (HCC)     Rx / DC Orders   ED Discharge Orders     None        Note:  This document was prepared using Dragon voice recognition software and may include unintentional dictation errors.    Dionne Bucy, MD 02/14/22 1053

## 2022-02-14 NOTE — H&P (Addendum)
History and Physical    Jonathan Griffin DGU:[id number] DOB: 1965/04/27 DOA: 02/14/2022  Referring MD/NP/PA:   PCP: [facility]   Patient coming from:  The patient is coming from home.          Chief Complaint: AMS  HPI: Jonathan Griffin is a 57 y.o. 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  Patient speaks Spanish, history is obtained with iPad interpreter's help. Patient was recently hospitalized from 12/28 - 12/29 due to hepatic encephalopathy.  Per his wife, patient has not taken his lactulose in the past several days, and developed confusion.  Patient has been confused for 3 days.  No fall or any injury.  When I saw patient in ED, patient is confused, knows his own name, not oriented to time and place.  Patient moves all extremities normally.  No facial droop or slurred speech.  Patient does not have chest pain, cough, shortness breath.  No nausea vomiting, diarrhea or abdominal pain.  No symptoms of UTI.  No fever or chills.  Data reviewed independently and ED Course: pt was found to have ammonia level of 158, DKA (blood sugar 414, bicarbonate 18, anion gap 17, beta hydroxybutyric acid 1.19), WBC 6.0, temperature normal, blood pressure 123/84, heart rate 77, RR 18, oxygen saturation 99% on room air.  Patient is admitted to the stepdown as inpatient.   EKG: I have personally reviewed.  Sinus rhythm, QTc 488, LAE, early R wave progression  Review of Systems: Cannot be reviewed accurately due to altered mental status.    Allergy: No Known Allergies  Past Medical History:  Diagnosis Date   Cirrhosis of liver with ascites ([place])    Diabetes mellitus without complication ([place])    Hyperlipemia    Hypertension     Past Surgical History:  Procedure Laterality Date   BACK  SURGERY     COLONOSCOPY WITH PROPOFOL N/A 02/06/2021   Procedure: COLONOSCOPY WITH PROPOFOL;  Surgeon: Lucilla Lame, MD;  Location: [facility];  Service: Endoscopy;  Laterality: N/A;  Diabetic   ESOPHAGEAL BANDING  12/12/2020   Procedure: ESOPHAGEAL BANDING x 3 bands;  Surgeon: Lucilla Lame, MD;  Location: [facility];  Service: Endoscopy;;   ESOPHAGEAL BANDING  02/06/2021   Procedure: ESOPHAGEAL BANDING;  Surgeon: Lucilla Lame, MD;  Location: [facility];  Service: Endoscopy;;   ESOPHAGOGASTRODUODENOSCOPY N/A 02/06/2021   Procedure: ESOPHAGOGASTRODUODENOSCOPY (EGD);  Surgeon: Lucilla Lame, MD;  Location: [facility];  Service: Endoscopy;  Laterality: N/A;   ESOPHAGOGASTRODUODENOSCOPY (EGD) WITH PROPOFOL N/A 12/12/2020   Procedure: ESOPHAGOGASTRODUODENOSCOPY (EGD) WITH PROPOFOL;  Surgeon: Lucilla Lame, MD;  Location: [facility];  Service: Endoscopy;  Laterality: N/A;   LIPOMA EXCISION     neck    Social History:  reports that he quit smoking about 16 months ago. His smoking use included cigarettes. He has a 4.00 pack-year smoking history. He has quit using smokeless tobacco. He reports that he does not currently use alcohol after a past usage of about 42.0 standard drinks of alcohol per week. He reports that he does not use drugs.  Family History:  Family History  Problem Relation Age of Onset   Diabetes Father    Hypertension Father      Prior to Admission medications   Medication Sig Start Date End Date Taking? Authorizing Provider  blood  glucose meter kit and supplies Dispense based on patient and insurance preference. Use up to four times daily as directed. (FOR ICD-9 250.00, 250.01). Patient not taking: Reported on 12/09/2020 05/04/16   Kathrine Haddock, NP  furosemide (LASIX) 40 MG tablet Take 1 tablet (40 mg total) by mouth daily. 01/30/22 01/30/23  Fritzi Mandes, MD  lactulose (CHRONULAC) 10 GM/15ML solution Take 45 mLs (30 g total) by mouth 2 (two)  times daily. 01/30/22   Fritzi Mandes, MD  metFORMIN (GLUCOPHAGE) 850 MG tablet Take 850 mg by mouth 2 (two) times daily with a meal.    [provider]  spironolactone (ALDACTONE) 100 MG tablet Take 1 tablet (100 mg total) by mouth daily. 01/30/22 01/30/23  Fritzi Mandes, MD    Physical Exam: Vitals:   02/14/22 0803 02/14/22 0804  BP: 123/84   Pulse: 77   Resp: 18   Temp:  98 F (36.7 C)  TempSrc:  Oral  SpO2: 99%   Weight:  101.2 kg  Height:  5\' 6"  (1.676 m)   General: Not in acute distress HEENT:       Eyes: PERRL, EOMI, no scleral icterus.       ENT: No discharge from the ears and nose       Neck: No JVD, no bruit, no mass felt. Heme: No neck lymph node enlargement. Cardiac: S1/S2, RRR, No murmurs, No gallops or rubs. Respiratory: No rales, wheezing, rhonchi or rubs. GI: Soft, nondistended, nontender, no organomegaly, BS present. GU: No hematuria Ext: No pitting leg edema bilaterally. 1+DP/PT pulse bilaterally. Musculoskeletal: No joint deformities, No joint redness or warmth, no limitation of ROM in spin. Skin: No rashes.  Neuro: Confused, knows his own name, not orientated to place and time. cranial nerves II-XII grossly intact, moves all extremities normally. Marland Kitchen Psych: Patient is not psychotic, no suicidal or hemocidal ideation.  Labs on Admission: I have personally reviewed following labs and imaging studies  CBC: Recent Labs  Lab 02/14/22 0808  WBC 6.0  HGB 13.7  HCT 41.6  MCV 86.5  PLT 102   Basic Metabolic Panel: Recent Labs  Lab 02/14/22 0808  NA 134*  K 5.4*  CL 99  CO2 18*  GLUCOSE 414*  BUN 25*  CREATININE 1.33*  CALCIUM 9.9   GFR: Estimated Creatinine Clearance: 69.1 mL/min (A) (by C-G formula based on SCr of 1.33 mg/dL (H)). Liver Function Tests: Recent Labs  Lab 02/14/22 0808  AST 41  ALT 31  ALKPHOS 205*  BILITOT 1.2  PROT 7.8  ALBUMIN 3.7   No results for input(s): "LIPASE", "AMYLASE" in the last 168 hours. Recent Labs   Lab 02/14/22 0808  AMMONIA 158*   Coagulation Profile: Recent Labs  Lab 02/14/22 0845  INR 1.2   Cardiac Enzymes: No results for input(s): "CKTOTAL", "CKMB", "CKMBINDEX", "TROPONINI" in the last 168 hours. BNP (last 3 results) No results for input(s): "PROBNP" in the last 8760 hours. HbA1C: No results for input(s): "HGBA1C" in the last 72 hours. CBG: Recent Labs  Lab 02/14/22 0952 02/14/22 1125  GLUCAP 366* 250*   Lipid Profile: No results for input(s): "CHOL", "HDL", "LDLCALC", "TRIG", "CHOLHDL", "LDLDIRECT" in the last 72 hours. Thyroid Function Tests: No results for input(s): "TSH", "T4TOTAL", "FREET4", "T3FREE", "THYROIDAB" in the last 72 hours. Anemia Panel: No results for input(s): "VITAMINB12", "FOLATE", "FERRITIN", "TIBC", "IRON", "RETICCTPCT" in the last 72 hours. Urine analysis:    Component Value Date/Time   COLORURINE STRAW (A) 01/29/2022 1730   APPEARANCEUR CLEAR (A)  01/29/2022 1730   LABSPEC 1.010 01/29/2022 1730   PHURINE 5.0 01/29/2022 1730   GLUCOSEU >=500 (A) 01/29/2022 1730   HGBUR NEGATIVE 01/29/2022 1730   BILIRUBINUR NEGATIVE 01/29/2022 1730   KETONESUR NEGATIVE 01/29/2022 1730   PROTEINUR NEGATIVE 01/29/2022 1730   NITRITE NEGATIVE 01/29/2022 1730   LEUKOCYTESUR NEGATIVE 01/29/2022 1730   Sepsis Labs: @LABRCNTIP (procalcitonin:4,lacticidven:4) ) Recent Results (from the past 240 hour(s))  Resp panel by RT-PCR (RSV, Flu A&B, Covid) Anterior Nasal Swab     Status: None   Collection Time: 02/14/22  9:56 AM   Specimen: Anterior Nasal Swab  Result Value Ref Range Status   SARS Coronavirus 2 by RT PCR NEGATIVE NEGATIVE Final    Comment: (NOTE) SARS-CoV-2 target nucleic acids are NOT DETECTED.  The SARS-CoV-2 RNA is generally detectable in upper respiratory specimens during the acute phase of infection. The lowest concentration of SARS-CoV-2 viral copies this assay can detect is 138 copies/mL. A negative result does not preclude  SARS-Cov-2 infection and should not be used as the sole basis for treatment or other patient management decisions. A negative result may occur with  improper specimen collection/handling, submission of specimen other than nasopharyngeal swab, presence of viral mutation(s) within the areas targeted by this assay, and inadequate number of viral copies(<138 copies/mL). A negative result must be combined with clinical observations, patient history, and epidemiological information. The expected result is Negative.  Fact Sheet for Patients:  02/16/22  Fact Sheet for Healthcare Providers:  BloggerCourse.com  This test is no t yet approved or cleared by the [employer] FDA and  has been authorized for detection and/or diagnosis of SARS-CoV-2 by FDA under an Emergency Use Authorization (EUA). This EUA will remain  in effect (meaning this test can be used) for the duration of the COVID-19 declaration under Section 564(b)(1) of the Act, 21 U.S.C.section 360bbb-3(b)(1), unless the authorization is terminated  or revoked sooner.       Influenza A by PCR NEGATIVE NEGATIVE Final   Influenza B by PCR NEGATIVE NEGATIVE Final    Comment: (NOTE) The Xpert Xpress SARS-CoV-2/FLU/RSV plus assay is intended as an aid in the diagnosis of influenza from Nasopharyngeal swab specimens and should not be used as a sole basis for treatment. Nasal washings and aspirates are unacceptable for Xpert Xpress SARS-CoV-2/FLU/RSV testing.  Fact Sheet for Patients: [place]  Fact Sheet for Healthcare Providers: BloggerCourse.com  This test is not yet approved or cleared by the [employer]ious[employer].[place] FDA and has been authorized for detection and/or diagnosis of SARS-CoV-2 by FDA under an Emergency Use Authorization (EUA). This EUA will remain in effect (meaning this test can be used) for the duration of  the COVID-19 declaration under Section 564(b)(1) of the Act, 21 U.S.C. section 360bbb-3(b)(1), unless the authorization is terminated or revoked.     Resp Syncytial Virus by PCR NEGATIVE NEGATIVE Final    Comment: (NOTE) Fact Sheet for Patients: [place]  Fact Sheet for Healthcare Providers: BloggerCourse.com  This test is not yet approved or cleared by the [employer]ious[employer].[place] FDA and has been authorized for detection and/or diagnosis of SARS-CoV-2 by FDA under an Emergency Use Authorization (EUA). This EUA will remain in effect (meaning this test can be used) for the duration of the COVID-19 declaration under Section 564(b)(1) of the Act, 21 U.S.C. section 360bbb-3(b)(1), unless the authorization is terminated or revoked.  Performed at [facility], [address] [place], [place]      Radiological Exams on Admission: No results  found.    Assessment/Plan Principal Problem:   DKA (diabetic ketoacidosis) (HCC) Active Problems:   Hepatic encephalopathy (HCC)   Alcoholic cirrhosis of liver with ascites (HCC)   Hyperkalemia   Hypertension   Type 2 diabetes mellitus without complications (HCC)   Hyperlipidemia   Obesity (BMI 30-39.9)   Assessment and Plan:  DKA (diabetic ketoacidosis) (HCC): pt likely has early stage of DKA, with blood sugar 414, bicarbonate 18, anion gap 17, beta hydroxybutyric acid 1.19. pH 7.39 on VBG.  - Admit to stepdown as inpt - IVF:  1L of NS bolus - start DKA protocol with BMP q4h - IVF: LR at 125 cc/h, will switch to D5-LR at 125 cc/h when CBG<250 - replete K as needed - Zofran prn nausea  - NPO  - consult to diabetic educator  Hepatic encephalopathy (HCC):  ammonia 158. No fall or any injury. -Lactulose 30 g 3 times daily -Frequent neurocheck -Fall precaution -f/u CT-head.  Alcoholic cirrhosis of liver with ascites (HCC): INR 1.2 -check PTT -hold Lasix and  spironolactone since patient need IV fluid  Hyperkalemia: K 5.4 due to DKA -Expecting correction with IV fluid and insulin drip  Hypertension:  -IV hydralazine as needed -Hold Lasix and spironolactone as above  Type 2 diabetes mellitus without complications (HCC): Recent A1c 13.9, poorly controlled.  Patient is taking metformin at home -DKA protocol -Patient may need addition of oral diabetic medication or insulin in addition to metformin  Hyperlipidemia: Patient does not appear to be Statin -Following up with PCP  Obesity (BMI 30-39.9): BMI 35.99, body weight 1 1.2 kg. Meets criteria for morbid obesity with BMI greater than 35 and comorbidities of diabetes  -Exercise and healthy diet -Encouraged losing weight.      DVT ppx: SQ Lovenox  Code Status: Full code  Family Communication:     Yes, patient's  wife  at bed side.      Disposition Plan:  Anticipate discharge back to previous environment  Consults called:  none  Admission status and Level of care: Stepdown:   as inpt     Dispo: The patient is from: Home              Anticipated d/c is to: Home              Anticipated d/c date is: 2 days              Patient currently is not medically stable to d/c.    Severity of Illness:  The appropriate patient status for this patient is INPATIENT. Inpatient status is judged to be reasonable and necessary in order to provide the required intensity of service to ensure the patient's safety. The patient's presenting symptoms, physical exam findings, and initial radiographic and laboratory data in the context of their chronic comorbidities is felt to place them at high risk for further clinical deterioration. Furthermore, it is not anticipated that the patient will be medically stable for discharge from the hospital within 2 midnights of admission.   * I certify that at the point of admission it is my clinical judgment that the patient will require inpatient hospital care spanning  beyond 2 midnights from the point of admission due to high intensity of service, high risk for further deterioration and high frequency of surveillance required.*       Date of Service 02/14/2022    Lorretta Harp [facility]   If 7PM-7AM, please contact night-coverage [email or web address] 02/14/2022, 11:58 AM

## 2022-02-15 DIAGNOSIS — I10 Essential (primary) hypertension: Secondary | ICD-10-CM | POA: Diagnosis not present

## 2022-02-15 DIAGNOSIS — K7682 Hepatic encephalopathy: Secondary | ICD-10-CM | POA: Diagnosis not present

## 2022-02-15 DIAGNOSIS — K7031 Alcoholic cirrhosis of liver with ascites: Secondary | ICD-10-CM | POA: Diagnosis not present

## 2022-02-15 DIAGNOSIS — E1010 Type 1 diabetes mellitus with ketoacidosis without coma: Secondary | ICD-10-CM | POA: Diagnosis not present

## 2022-02-15 LAB — GLUCOSE, CAPILLARY
Glucose-Capillary: 204 mg/dL — ABNORMAL HIGH (ref 70–99)
Glucose-Capillary: 201 mg/dL — ABNORMAL HIGH (ref 70–99)
Glucose-Capillary: 195 mg/dL — ABNORMAL HIGH (ref 70–99)
Glucose-Capillary: 210 mg/dL — ABNORMAL HIGH (ref 70–99)
Glucose-Capillary: 180 mg/dL — ABNORMAL HIGH (ref 70–99)
Glucose-Capillary: 145 mg/dL — ABNORMAL HIGH (ref 70–99)
Glucose-Capillary: 174 mg/dL — ABNORMAL HIGH (ref 70–99)
Glucose-Capillary: 308 mg/dL — ABNORMAL HIGH (ref 70–99)
Glucose-Capillary: 400 mg/dL — ABNORMAL HIGH (ref 70–99)
Glucose-Capillary: 410 mg/dL — ABNORMAL HIGH (ref 70–99)
Glucose-Capillary: 378 mg/dL — ABNORMAL HIGH (ref 70–99)
Glucose-Capillary: 243 mg/dL — ABNORMAL HIGH (ref 70–99)

## 2022-02-15 LAB — CBC
HCT: 39.6 % (ref 39.0–52.0)
Hemoglobin: 13.3 g/dL (ref 13.0–17.0)
MCH: 28.8 pg (ref 26.0–34.0)
MCHC: 33.6 g/dL (ref 30.0–36.0)
MCV: 85.7 fL (ref 80.0–100.0)
Platelets: 154 10*3/uL (ref 150–400)
RBC: 4.62 MIL/uL (ref 4.22–5.81)
RDW: 15.9 % — ABNORMAL HIGH (ref 11.5–15.5)
WBC: 4.5 10*3/uL (ref 4.0–10.5)
nRBC: 0 % (ref 0.0–0.2)

## 2022-02-15 LAB — MRSA NEXT GEN BY PCR, NASAL: MRSA by PCR Next Gen: NOT DETECTED

## 2022-02-15 LAB — BASIC METABOLIC PANEL
Anion gap: 10 (ref 5–15)
BUN: 14 mg/dL (ref 6–20)
CO2: 20 mmol/L — ABNORMAL LOW (ref 22–32)
Calcium: 8.9 mg/dL (ref 8.9–10.3)
Chloride: 108 mmol/L (ref 98–111)
Creatinine, Ser: 0.8 mg/dL (ref 0.61–1.24)
GFR, Estimated: >60 mL/min (ref >60)
Glucose, Bld: 201 mg/dL — ABNORMAL HIGH (ref 70–99)
Potassium: 3.5 mmol/L (ref 3.5–5.1)
Sodium: 138 mmol/L (ref 135–145)

## 2022-02-15 LAB — PHOSPHORUS: Phosphorus: 2.8 mg/dL (ref 2.5–4.6)

## 2022-02-15 LAB — MAGNESIUM: Magnesium: 1.7 mg/dL (ref 1.7–2.4)

## 2022-02-15 LAB — AMMONIA: Ammonia: 79 umol/L — ABNORMAL HIGH (ref 9–35)

## 2022-02-15 MED ORDER — INSULIN GLARGINE-YFGN 100 UNIT/ML ~~LOC~~ SOLN
20.0000 [IU] | SUBCUTANEOUS | Status: DC
  Administered 2022-02-15: 20 [IU] via SUBCUTANEOUS
  Filled 2022-02-15 (×2): qty 0.2

## 2022-02-15 MED ORDER — FUROSEMIDE 40 MG PO TABS
40.0000 mg | ORAL_TABLET | Freq: Every day | ORAL | Status: DC
  Administered 2022-02-15 – 2022-02-16 (×2): 40 mg via ORAL
  Filled 2022-02-15: qty 1
  Filled 2022-02-15: qty 2

## 2022-02-15 MED ORDER — LACTATED RINGERS IV SOLN: INTRAVENOUS | Status: DC

## 2022-02-15 MED ORDER — GLIPIZIDE 5 MG PO TABS
5.0000 mg | ORAL_TABLET | Freq: Every day | ORAL | Status: DC
  Filled 2022-02-15: qty 1

## 2022-02-15 MED ORDER — INSULIN STARTER KIT- PEN NEEDLES (SPANISH)
1.0000 | Freq: Once | Status: DC
  Filled 2022-02-15: qty 1

## 2022-02-15 MED ORDER — INSULIN ASPART 100 UNIT/ML IJ SOLN
0.0000 [IU] | Freq: Three times a day (TID) | INTRAMUSCULAR | Status: DC
  Administered 2022-02-15: 3 [IU] via SUBCUTANEOUS
  Administered 2022-02-15: 15 [IU] via SUBCUTANEOUS
  Administered 2022-02-15: 7 [IU] via SUBCUTANEOUS
  Administered 2022-02-16: 15 [IU] via SUBCUTANEOUS
  Administered 2022-02-16: 20 [IU] via SUBCUTANEOUS
  Filled 2022-02-15 (×5): qty 1

## 2022-02-15 MED ORDER — SPIRONOLACTONE 25 MG PO TABS
100.0000 mg | ORAL_TABLET | Freq: Every day | ORAL | Status: DC
  Administered 2022-02-15 – 2022-02-16 (×2): 100 mg via ORAL
  Filled 2022-02-15 (×2): qty 4

## 2022-02-15 MED ORDER — INSULIN ASPART 100 UNIT/ML IJ SOLN
25.0000 [IU] | Freq: Once | INTRAMUSCULAR | Status: AC
  Administered 2022-02-15: 25 [IU] via SUBCUTANEOUS
  Filled 2022-02-15: qty 1

## 2022-02-15 MED ORDER — LACTULOSE 10 GM/15ML PO SOLN
30.0000 g | Freq: Two times a day (BID) | ORAL | Status: DC
  Administered 2022-02-15 – 2022-02-16 (×3): 30 g via ORAL
  Filled 2022-02-15 (×3): qty 60

## 2022-02-15 NOTE — Progress Notes (Signed)
Patient transferred to unit from ICU, vs stable, bed in lowest position, patient and spouse oriented to unit, call light within reach.

## 2022-02-15 NOTE — Progress Notes (Signed)
CROSS COVER NOTE  NAME: Jonathan Griffin MRN: [id number] DOB : [birth year]  HPI/Events of Note   DKA on insulin infusion with endotool dosing.   Assessment and  Interventions   Assessment: Gap closed. CO2 20 K3.5, glucose 201, creat 0.80, NA 138; per nurse patient is able to eat Plan: Semglee 0.2 units per kg = 20 High dose sliding scale NS at 100 for maintenance fluids Potassium supplemented       Kathlene Cote NP [facility]

## 2022-02-15 NOTE — Progress Notes (Signed)
[facility] [place] NAME: Jonathan Griffin    MR#:  [id number]  DATE OF BIRTH:  [birth year]  SUBJECTIVE:  Via video [place] interpreter. Wife at bedside. Patient came in with confusion and found to be in mild DKA and elevated ammonia. Ran out of lactulose per wife Off insulin gtt. answering most questions appropriately.   VITALS:  Blood pressure 123/78, pulse 88, temperature 97.7 F (36.5 C), temperature source Oral, resp. rate 20, height 5\' 6"  (1.676 m), weight 102.2 kg, SpO2 99 %.  PHYSICAL EXAMINATION:   GENERAL:  57 y.o.-year-old patient lying in the bed with no acute distress.  LUNGS: Normal breath sounds bilaterally, no wheezing CARDIOVASCULAR: S1, S2 normal. No murmur   ABDOMEN: Soft, nontender, nondistended. Bowel sounds present.  EXTREMITIES: No  edema b/l.    NEUROLOGIC: nonfocal  patient is alert and awake SKIN: No obvious rash, lesion, or ulcer.   LABORATORY PANEL:  CBC Recent Labs  Lab 02/15/22 0618  WBC 4.5  HGB 13.3  HCT 39.6  PLT 154    Chemistries  Recent Labs  Lab 02/14/22 0808 02/14/22 1148 02/15/22 0321 02/15/22 0324  NA 134*   < >  --  138  K 5.4*   < >  --  3.5  CL 99   < >  --  108  CO2 18*   < >  --  20*  GLUCOSE 414*   < >  --  201*  BUN 25*   < >  --  14  CREATININE 1.33*   < >  --  0.80  CALCIUM 9.9   < >  --  8.9  MG  --   --  1.7  --   AST 41  --   --   --   ALT 31  --   --   --   ALKPHOS 205*  --   --   --   BILITOT 1.2  --   --   --    < > = values in this interval not displayed.   Cardiac Enzymes No results for input(s): "TROPONINI" in the last 168 hours. RADIOLOGY:  CT HEAD WO CONTRAST (5MM)  Result Date: 02/14/2022 CLINICAL DATA:  Mental status change, unknown cause EXAM: CT HEAD WITHOUT CONTRAST TECHNIQUE: Contiguous axial images were obtained from the base of the skull through the vertex without intravenous contrast. RADIATION DOSE REDUCTION: This exam was performed according  to the departmental dose-optimization program which includes automated exposure control, adjustment of the mA and/or kV according to patient size and/or use of iterative reconstruction technique. COMPARISON:  None Available. FINDINGS: Brain: No evidence of acute infarction, hemorrhage, hydrocephalus, extra-axial collection or mass lesion/mass effect. Vascular: No hyperdense vessel or unexpected calcification. Skull: Normal. Negative for fracture or focal lesion. Sinuses/Orbits: Complete opacification of the right maxillary sinus with large sinonasal polyp or retention cyst extending through the medial wall of the right maxillary sinus into the right nasal passage. Partial right ethmoid air cell opacification. There is a retention cyst in the left maxillary sinus. Other: None. IMPRESSION: 1. No acute intracranial abnormality. 2. Complete opacification of the right maxillary sinus with large sinonasal polyp or retention cyst extending through the medial wall of the right maxillary sinus into the right nasal passage. Electronically Signed   By: Davina Poke D.O.   On: 02/14/2022 15:55    Assessment and Plan  ALBI RAPPAPORT is a 57 y.o. 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   DKA (diabetic ketoacidosis) ([place]): pt likely has early stage of DKA, with blood sugar 414, bicarbonate 18, anion gap 17, beta hydroxybutyric acid 1.19. pH 7.39 on VBG.  --closed AG. Sugars better --A1c 13.9%--will start Insulin for home and Glipizide --d/c metformin -- consult to diabetic educator--teaching pt and wife    Hepatic encephalopathy ([place]):  ammonia 158-->79. -Lactulose bid -Frequent neurocheck -Fall precaution -f/u CT-head--no injury. Right maxillary sinusitis/polyp--out  pt ENT f/u if pt desires   Alcoholic  cirrhosis of liver with ascites ([place]): INR 1.2 - resume lasix and spironolactone\  Hyperkalemia: K 5.4 due to DKA K 3.5   Hypertension:  -IV hydralazine as needed -cont  Lasix and spironolactone.   Type 2 diabetes mellitus without complications [facility]): Recent A1c 13.9, poorly controlled.  Patient is taking metformin at home --as above   Hyperlipidemia: Patient does not appear to be Statin -Following up with PCP   Obesity (BMI 30-39.9): BMI 35.99, body weight 1 1.2 kg. Meets criteria for morbid obesity with BMI greater than 35 and comorbidities of diabetes  -Exercise and healthy diet -Encouraged losing weight.      DVT ppx: SQ Lovenox Code Status: Full code Family Communication:     Yes, patient's  wife  at bed side.     Disposition Plan:  Anticipate discharge back to previous environment 02/16/2022   Transfer to [place] THIS PATIENT: 35 minutes.  >50% time spent on counselling and coordination of care  Note: This dictation was prepared with Dragon dictation along with smaller phrase technology. Any transcriptional errors that result from this process are unintentional.  Fritzi Mandes M.D    [facility]   CC: Primary care physician; [facility]

## 2022-02-15 NOTE — Inpatient Diabetes Management (Signed)
Dr. Posey Pronto--  Here are the Order Numbers for the following discharge meds:  Lantus Insulin Pen: [id number]  Insulin Pen Needles: [id number]  Glipizide 5 mg tablets: [id number]  CBG meter: [id number]    --Will follow patient during hospitalization--  Wyn Quaker RN, MSN, [place] Diabetes Coordinator Inpatient Glycemic Control Team Team Pager: [phone] (8a-5p)

## 2022-02-16 ENCOUNTER — Other Ambulatory Visit (HOSPITAL_COMMUNITY): Payer: Self-pay

## 2022-02-16 DIAGNOSIS — K7031 Alcoholic cirrhosis of liver with ascites: Secondary | ICD-10-CM | POA: Diagnosis not present

## 2022-02-16 DIAGNOSIS — E1010 Type 1 diabetes mellitus with ketoacidosis without coma: Secondary | ICD-10-CM | POA: Diagnosis not present

## 2022-02-16 DIAGNOSIS — F1011 Alcohol abuse, in remission: Secondary | ICD-10-CM

## 2022-02-16 DIAGNOSIS — K7682 Hepatic encephalopathy: Secondary | ICD-10-CM | POA: Diagnosis not present

## 2022-02-16 DIAGNOSIS — E119 Type 2 diabetes mellitus without complications: Secondary | ICD-10-CM | POA: Diagnosis not present

## 2022-02-16 DIAGNOSIS — E1110 Type 2 diabetes mellitus with ketoacidosis without coma: Principal | ICD-10-CM

## 2022-02-16 LAB — GLUCOSE, CAPILLARY
Glucose-Capillary: 372 mg/dL — ABNORMAL HIGH (ref 70–99)
Glucose-Capillary: 333 mg/dL — ABNORMAL HIGH (ref 70–99)

## 2022-02-16 MED ORDER — INSULIN ASPART PROT & ASPART (70-30 MIX) 100 UNIT/ML ~~LOC~~ SUSP
22.0000 [IU] | Freq: Two times a day (BID) | SUBCUTANEOUS | Status: DC
  Administered 2022-02-16: 22 [IU] via SUBCUTANEOUS
  Filled 2022-02-16: qty 10

## 2022-02-16 MED ORDER — LACTULOSE 10 GM/15ML PO SOLN: 30.0000 g | Freq: Two times a day (BID) | ORAL | 3 refills | Status: AC

## 2022-02-16 MED ORDER — INSULIN GLARGINE-YFGN 100 UNIT/ML ~~LOC~~ SOLN: 25.0000 [IU] | SUBCUTANEOUS | Status: DC

## 2022-02-16 MED ORDER — GLIPIZIDE 5 MG PO TABS: 5.0000 mg | ORAL_TABLET | Freq: Two times a day (BID) | ORAL | 4 refills | Status: AC

## 2022-02-16 MED ORDER — LIVING WELL WITH DIABETES BOOK - IN SPANISH
Freq: Once | Status: AC
  Filled 2022-02-16: qty 1

## 2022-02-16 MED ORDER — INSULIN STARTER KIT- PEN NEEDLES (SPANISH): 1.0000 | Freq: Once | 0 refills | Status: AC

## 2022-02-16 MED ORDER — BLOOD GLUCOSE METER KIT: PACK | 0 refills | Status: AC

## 2022-02-16 MED ORDER — GLIPIZIDE 5 MG PO TABS
5.0000 mg | ORAL_TABLET | Freq: Two times a day (BID) | ORAL | Status: DC
  Administered 2022-02-16: 5 mg via ORAL
  Filled 2022-02-16 (×2): qty 1

## 2022-02-16 MED ORDER — INSULIN ASPART 100 UNIT/ML IJ SOLN: 5.0000 [IU] | Freq: Three times a day (TID) | INTRAMUSCULAR | Status: DC

## 2022-02-16 MED ORDER — INSULIN ASPART PROT & ASPART (70-30 MIX) 100 UNIT/ML ~~LOC~~ SUSP
25.0000 [IU] | Freq: Two times a day (BID) | SUBCUTANEOUS | Status: DC
  Filled 2022-02-16: qty 10

## 2022-02-16 MED ORDER — "PEN NEEDLES 3/16"" 31G X 5 MM MISC": 1.0000 | Freq: Two times a day (BID) | 4 refills | Status: AC

## 2022-02-16 MED ORDER — NOVOLIN 70/30 FLEXPEN RELION (70-30) 100 UNIT/ML ~~LOC~~ SUPN: 25.0000 [IU] | PEN_INJECTOR | Freq: Two times a day (BID) | SUBCUTANEOUS | 11 refills | Status: AC

## 2022-02-16 NOTE — Discharge Instructions (Signed)
Keep log of sugars at home for PCP to review

## 2022-02-16 NOTE — Inpatient Diabetes Management (Addendum)
Inpatient Diabetes Program Recommendations  AACE/ADA: New Consensus Statement on Inpatient Glycemic Control   Target Ranges:  Prepandial:   less than 140 mg/dL      Peak postprandial:   less than 180 mg/dL (1-2 hours)      Critically ill patients:  140 - 180 mg/dL    Latest Reference Range & Units 02/16/22 08:46  Glucose-Capillary 70 - 99 mg/dL 372 (H)    Latest Reference Range & Units 02/15/22 07:31 02/15/22 12:11 02/15/22 16:18 02/15/22 16:25 02/15/22 17:29 02/15/22 21:59  Glucose-Capillary 70 - 99 mg/dL 145 (H) 308 (H) 410 (H) 400 (H) 378 (H) 243 (H)    Latest Reference Range & Units 02/14/22 08:08  CO2 22 - 32 mmol/L 18 (L)  Glucose 70 - 99 mg/dL 414 (H)  BUN 6 - 20 mg/dL 25 (H)  Creatinine 0.61 - 1.24 mg/dL 1.33 (H)  Calcium 8.9 - 10.3 mg/dL 9.9  Anion gap 5 - 15  17 (H)   Review of Glycemic Control  Diabetes history: DM2 Outpatient Diabetes medications: Metformin 850 mg BID Current orders for Inpatient glycemic control: Semglee 20 units QHS, Novolog 0-20 units AC&HS, Glipizide 5 mg QAM  Inpatient Diabetes Program Recommendations:    Insulin: Please discontinue Semglee and ordering 70/30 22 units BID to start now (dose will provide total of 30.8 units for basal and 13.2 units for meal coverage per day).   Insulin Outpatient: Due to cost of basal insulin (Semglee) and increase in glucose after meal intake, would recommend to change insulin to Novolin 70/30 (which is $43 per box of 5 insulin pens at [facility]).   NOTE: Per MD plan was to discharge patient on basal insulin and Glipizide. Had [facility] outpatient pharmacy to check copay for basal insulins and informed that Lantus and Basaglar require prior authorization and Semglee copay is $198.32. In reviewing glucose trends, appears patient will need basal and meal coverage insulin. Due to cost of Semglee and need for meal coverage in addition to basal insulin, would recommend to discharge on Novolin 70/30 insulin. Patient will need Rx  for Novolin 70/30 pens (#[id number]), pen needles (#[id number]). Will plan to see patient today.  Addendum 02/16/22@10 :50-Spoke with patient and wife at bedside with on site interpreter Adin Hector) about diabetes and home regimen for diabetes control. Patient reports being followed by PCP in [facility] for diabetes management and reports he has an appointment with them on 02/25/22 but patient would like to get provider closer to home.  Patient has [employer] (thorough wife's work) and is currently taking on Metformin for DM.  Patient reports checking glucose 2-3 times per week. Patient nor wife are sure what glucose should be.  Discussed A1C results (13.9% on 01/30/22) and explained that  A1C indicates an average glucose of 352 mg/dl over the past 2-3 months. Discussed glucose and A1C goals. Reviewed hyperglycemia, treatment, and hypoglycemia along with treatment. Discussed importance of checking CBGs and maintaining good CBG control to prevent long-term and short-term complications. Explained how hyperglycemia leads to damage within blood vessels which lead to the common complications seen with uncontrolled diabetes. Stressed to the patient the importance of improving glycemic control to prevent further complications from uncontrolled diabetes. Discussed impact of nutrition, exercise, stress, sickness, and medications on diabetes control.  Discussed carbohydrates, carbohydrate goals per day and meal, along with portion sizes. Encouraged patient to check glucose 3-4 times per day (before meals and at bedtime) and to keep a log book of glucose readings and DM medication  taken which patient will need to take to doctor appointments. Explained how the doctor can use the log book to continue to make adjustments with DM medications if needed. Explained that patient will be discharged on Novolin 70/30 insulin. Discussed Novolin 70/30 insulin in detail, how it works, and how it is taken BID.  Discussed insulin storage,  injection sites, and importance of rotating injection site. Patient's wife has given patient insulin injections while inpatient with vial/syringe. Reviewed and demonstrated insulin pens and patient's wife would prefer insulin pens. Educated patient and spouse on insulin pen use at home. Reviewed all steps of insulin pen including attachment of needle, 2-unit air shot, dialing up dose, giving injection, removing needle, disposal of sharps, storage of unused insulin, disposal of insulin etc. Patient's wife will be giving patient injections and was able to provide successful return demonstration. Patient's wife had several questions about diet which was answered and discussed. Patient's wife reports that patient has been out of work for a while and she is interested in applying for disability for patient. Patient's wife states that she went to the health department and applied for Medicaid but was denied. Informed patient that I would let TOC know and see if they can provide resources for applying for disability.   Patient verbalized understanding of information discussed and reports no further questions at this time related to diabetes. Talked with Isaias Cowman, RN, TOC regarding patient's wife request about signing up for disability.   Thanks, Barnie Alderman, RN, MSN, [place] Diabetes Coordinator Inpatient Diabetes Program [phone] (Team Pager from 8am to [place])

## 2022-02-16 NOTE — TOC Initial Note (Signed)
Transition of Care [facility]) - Initial/Assessment Note    Patient Details  Name: Jonathan Griffin MRN: [id number] Date of Birth: [birth year]  Transition of [facility]) CM/SW Contact:    Beverly Sessions, RN Phone Number: 02/16/2022, 12:28 PM  Clinical Narrative:                  Per MD patient request PCP in [facility]. Unit secretary has made hospital follow up appointment 1/23 [facility]     Per DM Rn patient requested information on applying for disability.  Provided [place] Adult Disability Starter Kit from social security administration    Patient Goals and CMS Choice            Expected Discharge Plan and Services         Expected Discharge Date: 02/16/22                                    Prior Living Arrangements/Services                       Activities of Daily Living Home Assistive Devices/Equipment: None ADL Screening (condition at time of admission) Is the patient deaf or have difficulty hearing?: No Does the patient have difficulty seeing, even when wearing glasses/contacts?: No Does the patient have difficulty concentrating, remembering, or making decisions?: No Does the patient have difficulty dressing or bathing?: No Does the patient have difficulty walking or climbing stairs?: No  Permission Sought/Granted                  Emotional Assessment              Admission diagnosis:  Hepatic encephalopathy ([place]) [K76.82] DKA (diabetic ketoacidosis) ([place]) [E11.10] Hyperglycemia [R73.9] Patient Active Problem List   Diagnosis Date Noted   DKA (diabetic ketoacidosis) ([place]) 02/14/2022   Obesity (BMI 30-39.9) 02/14/2022   Hyperkalemia 02/14/2022   Hepatic encephalopathy ([place]) 01/29/2022   Morbid obesity ([place]) 01/29/2022   Unsteady gait 01/29/2022   Special screening for malignant neoplasms, colon    Portal hypertension ([place])    Esophageal varices without bleeding ([place])    Alcoholic cirrhosis of liver with ascites  ([place]) 11/02/2020   Anemia 11/02/2020   Sleep apnea 04/09/2015   Extreme obesity 04/09/2015   Hyperlipidemia 04/04/2015   Type 2 diabetes mellitus without complications ([place]) 78/93/8101   Hypertension 04/04/2015   PCP:  [facility] Pharmacy:   [id number] [address], [place] [address] Phone: [phone] Fax: [phone]  [id number] - [place], [place] - [address] [place] [address] Phone: [phone] Fax: [phone]  [place] [address] [place] [address] Phone: [phone] Fax: [phone]     Social Determinants of Health (SDOH) Social History: SDOH Screenings   Food Insecurity: No Food Insecurity (02/16/2022)  Transportation Needs: No Transportation Needs (02/16/2022)  Utilities: Not At Risk (02/16/2022)  Tobacco Use: Medium Risk (02/14/2022)   SDOH Interventions:     Readmission Risk Interventions     No data to display

## 2022-02-16 NOTE — Discharge Summary (Signed)
Physician Discharge Summary   Patient: Jonathan Griffin MRN: [id number] DOB: January 08, 1966  Admit date:     02/14/2022  Discharge date: 02/16/22  Discharge Physician: Fritzi Mandes   PCP: [facility]   Recommendations at discharge:     Keep log of sugars at home and d/w PCP F/u PCP in 1-2 weeks  Discharge Diagnoses: Principal Problem:   DKA (diabetic ketoacidosis) ([place]) Active Problems:   Hepatic encephalopathy ([place])   Alcoholic cirrhosis of liver with ascites ([place])   Hyperkalemia   Hypertension   Type 2 diabetes mellitus without complications ([place])   Hyperlipidemia   Obesity (BMI 30-39.84)  Jonathan Griffin is a 57 y.o. 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    DKA (diabetic ketoacidosis) ([place]): pt likely has early stage of DKA, with blood sugar 414, bicarbonate 18, anion gap 17, beta hydroxybutyric acid 1.19. pH 7.39 on VBG.  --closed AG. Sugars better --A1c 13.9%--will start Insulin 70/30 (affordable than others) and Glipizide --d/c metformin -- consult to diabetic educator--teaching pt and wife completed   Hepatic encephalopathy ([place]):  ammonia 158-->79. -Lactulose bid -Frequent neurocheck -Fall precaution -f/u CT-head--no injury. Right maxillary sinusitis/polyp--out  pt ENT f/u if pt desires   Alcoholic cirrhosis of liver with ascites ([place]): INR 1.2 - resume lasix and spironolactone\   Hyperkalemia: K 5.4 due to DKA K 3.5   Hypertension:  -IV hydralazine as needed -cont  Lasix and spironolactone.   Type 2 diabetes mellitus without complications [facility]): Recent A1c 13.9, poorly controlled.  Patient is taking metformin at home --as above   Hyperlipidemia: Patient does not appear to be  on Statin -Following up with PCP   Obesity (BMI 30-39.9): BMI 35.99, body  weight 1 1.2 kg. Meets criteria for morbid obesity with BMI greater than 35 and comorbidities of diabetes  -Exercise and healthy diet -Encouraged losing weight.      DVT ppx: SQ Lovenox Code Status: Full code Family Communication:     Yes, patient's  wife  at bed side.     Disposition Plan:  Anticipate discharge back to previous environment 02/16/2022   Overall improving d/c home      Disposition: Home Diet recommendation:  Discharge Diet Orders (From admission, onward)     Start     Ordered   02/16/22 0000  Diet Carb Modified        02/16/22 1133           Cardiac diet DISCHARGE MEDICATION: Allergies as of 02/16/2022   No Known Allergies      Medication List     TAKE these medications    blood glucose meter kit and supplies Dispense based on patient and insurance preference. Use up to 3 times daily as directed. (FOR ICD-10 E10.9, E11.9). What changed: additional instructions   furosemide 40 MG tablet Commonly known as: Lasix Take 1 tablet (40 mg total) by mouth daily.   glipiZIDE 5 MG tablet Commonly known as: GLUCOTROL Take 1 tablet (5 mg total) by mouth 2 (two) times daily before a meal.   insulin starter kit- pen needles Misc 1 kit by Other route once for 1 dose.   lactulose 10 GM/15ML solution Commonly known as: CHRONULAC Take 45 mLs (30 g total) by mouth 2 (two) times daily.   NovoLIN 70/30 Kwikpen (70-30) 100 UNIT/ML KwikPen Generic drug: insulin  isophane & regular human KwikPen Inject 25 Units into the skin 2 (two) times daily.   Pen Needles 3/16" 31G X 5 MM Misc 1 Needle by Does not apply route 2 (two) times daily.   spironolactone 100 MG tablet Commonly known as: Aldactone Take 1 tablet (100 mg total) by mouth daily.        Follow-up Information     [facility]. Schedule an appointment as soon as possible for a visit in 1 week(s).   Contact information: [address] [place] [address] [phone]                  Filed Weights   02/14/22 0804 02/14/22 2230  Weight: 101.2 kg 102.2 kg     Condition at discharge: fair  The results of significant diagnostics from this hospitalization (including imaging, microbiology, ancillary and laboratory) are listed below for reference.   Imaging Studies: CT HEAD WO CONTRAST ( )  Result Date: 02/14/2022 CLINICAL DATA:  Mental status change, unknown cause EXAM: CT HEAD WITHOUT CONTRAST TECHNIQUE: Contiguous axial images were obtained from the base of the skull through the vertex without intravenous contrast. RADIATION DOSE REDUCTION: This exam was performed according to the departmental dose-optimization program which includes automated exposure control, adjustment of the mA and/or kV according to patient size and/or use of iterative reconstruction technique. COMPARISON:  None Available. FINDINGS: Brain: No evidence of acute infarction, hemorrhage, hydrocephalus, extra-axial collection or mass lesion/mass effect. Vascular: No hyperdense vessel or unexpected calcification. Skull: Normal. Negative for fracture or focal lesion. Sinuses/Orbits: Complete opacification of the right maxillary sinus with large sinonasal polyp or retention cyst extending through the medial wall of the right maxillary sinus into the right nasal passage. Partial right ethmoid air cell opacification. There is a retention cyst in the left maxillary sinus. Other: None. IMPRESSION: 1. No acute intracranial abnormality. 2. Complete opacification of the right maxillary sinus with large sinonasal polyp or retention cyst extending through the medial wall of the right maxillary sinus into the right nasal passage. Electronically Signed   By: Duanne Guess D.O.   On: 02/14/2022 15:55    Microbiology: Results for orders placed or performed during the hospital encounter of 02/14/22  Resp panel by RT-PCR (RSV, Flu A&B, Covid) Anterior Nasal Swab     Status: None   Collection Time: 02/14/22  9:56 AM    Specimen: Anterior Nasal Swab  Result Value Ref Range Status   SARS Coronavirus 2 by RT PCR NEGATIVE NEGATIVE Final    Comment: (NOTE) SARS-CoV-2 target nucleic acids are NOT DETECTED.  The SARS-CoV-2 RNA is generally detectable in upper respiratory specimens during the acute phase of infection. The lowest concentration of SARS-CoV-2 viral copies this assay can detect is 138 copies/mL. A negative result does not preclude SARS-Cov-2 infection and should not be used as the sole basis for treatment or other patient management decisions. A negative result may occur with  improper specimen collection/handling, submission of specimen other than nasopharyngeal swab, presence of viral mutation(s) within the areas targeted by this assay, and inadequate number of viral copies(<138 copies/mL). A negative result must be combined with clinical observations, patient history, and epidemiological information. The expected result is Negative.  Fact Sheet for Patients:  BloggerCourse.com  Fact Sheet for Healthcare Providers:  SeriousBroker.it  This test is no t yet approved or cleared by the [place] FDA and  has been authorized for detection and/or diagnosis of SARS-CoV-2 by FDA under an Emergency Use Authorization (  EUA). This EUA will remain  in effect (meaning this test can be used) for the duration of the COVID-19 declaration under Section 564(b)(1) of the Act, 21 U.S.C.section 360bbb-3(b)(1), unless the authorization is terminated  or revoked sooner.       Influenza A by PCR NEGATIVE NEGATIVE Final   Influenza B by PCR NEGATIVE NEGATIVE Final    Comment: (NOTE) The Xpert Xpress SARS-CoV-2/FLU/RSV plus assay is intended as an aid in the diagnosis of influenza from Nasopharyngeal swab specimens and should not be used as a sole basis for treatment. Nasal washings and aspirates are unacceptable for Xpert Xpress  SARS-CoV-2/FLU/RSV testing.  Fact Sheet for Patients: EntrepreneurPulse.com.au  Fact Sheet for Healthcare Providers: IncredibleEmployment.be  This test is not yet approved or cleared by the [place] FDA and has been authorized for detection and/or diagnosis of SARS-CoV-2 by FDA under an Emergency Use Authorization (EUA). This EUA will remain in effect (meaning this test can be used) for the duration of the COVID-19 declaration under Section 564(b)(1) of the Act, 21 U.S.C. section 360bbb-3(b)(1), unless the authorization is terminated or revoked.     Resp Syncytial Virus by PCR NEGATIVE NEGATIVE Final    Comment: (NOTE) Fact Sheet for Patients: EntrepreneurPulse.com.au  Fact Sheet for Healthcare Providers: IncredibleEmployment.be  This test is not yet approved or cleared by the [place] FDA and has been authorized for detection and/or diagnosis of SARS-CoV-2 by FDA under an Emergency Use Authorization (EUA). This EUA will remain in effect (meaning this test can be used) for the duration of the COVID-19 declaration under Section 564(b)(1) of the Act, 21 U.S.C. section 360bbb-3(b)(1), unless the authorization is terminated or revoked.  Performed at [facility], [address] [place], [place] [address]   MRSA Next Gen by PCR, Nasal     Status: None   Collection Time: 02/14/22 10:36 PM   Specimen: Nasal Mucosa; Nasal Swab  Result Value Ref Range Status   MRSA by PCR Next Gen NOT DETECTED NOT DETECTED Final    Comment: (NOTE) The GeneXpert MRSA Assay (FDA approved for NASAL specimens only), is one component of a comprehensive MRSA colonization surveillance program. It is not intended to diagnose MRSA infection nor to guide or monitor treatment for MRSA infections. Test performance is not FDA approved in patients less than 21 years old. Performed at [facility], [address] [place], [place] [address]     Labs: CBC: Recent Labs  Lab 02/14/22 0808 02/15/22 0618  WBC 6.0 4.5  HGB 13.7 13.3  HCT 41.6 39.6  MCV 86.5 85.7  PLT 215 299   Basic Metabolic Panel: Recent Labs  Lab 02/14/22 1148 02/14/22 1452 02/14/22 1907 02/14/22 2234 02/15/22 0321 02/15/22 0324  NA 135 137 139 138  --  138  K 3.8 3.4* 3.7 3.7  --  3.5  CL 105 105 103 105  --  108  CO2 13* 15* 13* 20*  --  20*  GLUCOSE 272* 176* 206* 237*  --  201*  BUN 23* 20 18 16   --  14  CREATININE 1.08 1.00 0.92 0.90  --  0.80  CALCIUM 9.5 9.5 8.7* 9.5  --  8.9  MG  --   --   --   --  1.7  --   PHOS  --   --   --   --  2.8  --    Liver Function Tests: Recent Labs  Lab 02/14/22 0808  AST 41  ALT 31  ALKPHOS 205*  BILITOT 1.2  PROT 7.8  ALBUMIN 3.7   CBG: Recent Labs  Lab 02/15/22 1625 02/15/22 1729 02/15/22 2159 02/16/22 0846 02/16/22 1128  GLUCAP 400* 378* 243* 372* 333*    Discharge time spent: greater than 30 minutes.  Signed: Enedina Finner, MD [facility] 02/16/2022
